# Patient Record
Sex: Male | Born: 1955 | Hispanic: No | Marital: Married | Smoking: Never smoker
Health system: Southern US, Community
[De-identification: ages and names within clinical notes are randomized; demographics above are authoritative.]

## PROBLEM LIST (undated history)

## (undated) DIAGNOSIS — H539 Unspecified visual disturbance: Secondary | ICD-10-CM

## (undated) DIAGNOSIS — J45909 Unspecified asthma, uncomplicated: Secondary | ICD-10-CM

## (undated) DIAGNOSIS — R413 Other amnesia: Secondary | ICD-10-CM

## (undated) DIAGNOSIS — C801 Malignant (primary) neoplasm, unspecified: Secondary | ICD-10-CM

## (undated) DIAGNOSIS — I10 Essential (primary) hypertension: Secondary | ICD-10-CM

## (undated) DIAGNOSIS — C4491 Basal cell carcinoma of skin, unspecified: Secondary | ICD-10-CM

## (undated) DIAGNOSIS — F419 Anxiety disorder, unspecified: Secondary | ICD-10-CM

## (undated) DIAGNOSIS — T7840XA Allergy, unspecified, initial encounter: Secondary | ICD-10-CM

## (undated) DIAGNOSIS — M1990 Unspecified osteoarthritis, unspecified site: Secondary | ICD-10-CM

## (undated) DIAGNOSIS — E785 Hyperlipidemia, unspecified: Secondary | ICD-10-CM

## (undated) HISTORY — DX: Basal cell carcinoma of skin, unspecified: C44.91

## (undated) HISTORY — DX: Other amnesia: R41.3

## (undated) HISTORY — DX: Hyperlipidemia, unspecified: E78.5

## (undated) HISTORY — DX: Unspecified asthma, uncomplicated: J45.909

## (undated) HISTORY — DX: Malignant (primary) neoplasm, unspecified: C80.1

## (undated) HISTORY — DX: Anxiety disorder, unspecified: F41.9

## (undated) HISTORY — DX: Unspecified osteoarthritis, unspecified site: M19.90

## (undated) HISTORY — DX: Allergy, unspecified, initial encounter: T78.40XA

## (undated) HISTORY — PX: KNEE ARTHROSCOPY W/ MENISCAL REPAIR: SHX1877

## (undated) HISTORY — DX: Unspecified visual disturbance: H53.9

## (undated) HISTORY — DX: Essential (primary) hypertension: I10

---

## 2006-05-19 ENCOUNTER — Encounter: Admission: RE | Admit: 2006-05-19 | Discharge: 2006-05-19 | Payer: Self-pay | Admitting: Internal Medicine

## 2009-04-03 ENCOUNTER — Ambulatory Visit: Payer: Self-pay | Admitting: Internal Medicine

## 2009-11-14 ENCOUNTER — Ambulatory Visit: Payer: Self-pay | Admitting: Internal Medicine

## 2011-06-24 ENCOUNTER — Other Ambulatory Visit: Payer: Self-pay

## 2011-06-24 DIAGNOSIS — J45909 Unspecified asthma, uncomplicated: Secondary | ICD-10-CM

## 2011-06-24 DIAGNOSIS — F411 Generalized anxiety disorder: Secondary | ICD-10-CM

## 2011-06-24 MED ORDER — ALBUTEROL SULFATE HFA 108 (90 BASE) MCG/ACT IN AERS: 2.0000 | INHALATION_SPRAY | Freq: Four times a day (QID) | RESPIRATORY_TRACT | Status: DC | PRN

## 2011-06-24 MED ORDER — ALPRAZOLAM 0.5 MG PO TBDP: 0.5000 mg | ORAL_TABLET | Freq: Two times a day (BID) | ORAL | Status: AC | PRN

## 2011-11-08 ENCOUNTER — Other Ambulatory Visit: Payer: Self-pay | Admitting: Internal Medicine

## 2011-11-08 DIAGNOSIS — Z0000 Encounter for general adult medical examination without abnormal findings: Secondary | ICD-10-CM

## 2011-11-08 DIAGNOSIS — Z125 Encounter for screening for malignant neoplasm of prostate: Secondary | ICD-10-CM

## 2011-11-08 LAB — CBC WITH DIFFERENTIAL/PLATELET
Eosinophils Absolute: 0.2 10*3/uL (ref 0.0–0.7)
Eosinophils Relative: 5 % (ref 0–5)
Hemoglobin: 14.9 g/dL (ref 13.0–17.0)
MCHC: 34.8 g/dL (ref 30.0–36.0)
Monocytes Absolute: 0.6 10*3/uL (ref 0.1–1.0)
Neutro Abs: 1.5 10*3/uL — ABNORMAL LOW (ref 1.7–7.7)
Platelets: 122 10*3/uL — ABNORMAL LOW (ref 150–400)
RBC: 4.38 MIL/uL (ref 4.22–5.81)

## 2011-11-08 LAB — COMPREHENSIVE METABOLIC PANEL
Alkaline Phosphatase: 47 U/L (ref 39–117)
BUN: 14 mg/dL (ref 6–23)
Calcium: 9.2 mg/dL (ref 8.4–10.5)
Creat: 0.98 mg/dL (ref 0.50–1.35)
Potassium: 4.2 mEq/L (ref 3.5–5.3)
Sodium: 139 mEq/L (ref 135–145)
Total Bilirubin: 1 mg/dL (ref 0.3–1.2)
Total Protein: 6.3 g/dL (ref 6.0–8.3)

## 2011-11-08 LAB — LIPID PANEL
HDL: 48 mg/dL (ref >39)
Total CHOL/HDL Ratio: 4.3 Ratio
Triglycerides: 115 mg/dL (ref <150)

## 2011-11-09 LAB — PSA: PSA: 1.4 ng/mL (ref <=4.00)

## 2011-11-11 ENCOUNTER — Ambulatory Visit (INDEPENDENT_AMBULATORY_CARE_PROVIDER_SITE_OTHER): Payer: BC Managed Care – PPO | Admitting: Internal Medicine

## 2011-11-11 ENCOUNTER — Encounter: Payer: Self-pay | Admitting: Internal Medicine

## 2011-11-11 VITALS — BP 138/94 | HR 76 | Temp 98.2°F | Ht 73.5 in | Wt 211.0 lb

## 2011-11-11 DIAGNOSIS — J309 Allergic rhinitis, unspecified: Secondary | ICD-10-CM

## 2011-11-11 DIAGNOSIS — J45909 Unspecified asthma, uncomplicated: Secondary | ICD-10-CM | POA: Insufficient documentation

## 2011-11-11 DIAGNOSIS — D229 Melanocytic nevi, unspecified: Secondary | ICD-10-CM

## 2011-11-11 DIAGNOSIS — D239 Other benign neoplasm of skin, unspecified: Secondary | ICD-10-CM

## 2011-11-11 DIAGNOSIS — F419 Anxiety disorder, unspecified: Secondary | ICD-10-CM | POA: Insufficient documentation

## 2011-11-11 DIAGNOSIS — F411 Generalized anxiety disorder: Secondary | ICD-10-CM

## 2011-11-11 DIAGNOSIS — Z85828 Personal history of other malignant neoplasm of skin: Secondary | ICD-10-CM | POA: Insufficient documentation

## 2011-11-11 DIAGNOSIS — Z0000 Encounter for general adult medical examination without abnormal findings: Secondary | ICD-10-CM

## 2011-11-11 LAB — POCT URINALYSIS DIPSTICK
Bilirubin, UA: NEGATIVE
Blood, UA: NEGATIVE
Leukocytes, UA: NEGATIVE
Nitrite, UA: NEGATIVE
Protein, UA: NEGATIVE
Spec Grav, UA: 1.015
Urobilinogen, UA: NEGATIVE

## 2011-11-11 NOTE — Progress Notes (Signed)
Subjective:    Patient ID: Melvin Dixon, male    DOB: Jul 21, 1955, 56 y.o.   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 He also has administrative duties and wants the head of the [employer] this past year. He is here for physical examination. History of asthma. He takes allergy injections. Is allergic to cats in addition to outdoor plants. Says the immunotherapy is helped a great deal. He is to run a great deal but doesn't do that is much. Works out some in [employer] He and his wife live in [place]. She is employed at [employer]. This is his second marriage. He has 2 daughters from previous marriage.  History of right lower lobe pneumonia December 1994. History of prostatitis March 2000. No known drug allergies. Has had vasectomy December 1993. Tdap 8/11.  On May 14 he had several lesions shave biopsied at [facility] by Dr. Londell Moh. He had an area on his right sideburn area that proved to be a basal cell carcinoma.  He had an area on his mid back that was an irritated seborrheic keratosis.  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  He also had an area right inner thigh that was shaved biopsied also an atypical combined melanocytic nevus.  In the last 2 biopsies, reports says the margins were involved and reexcision was recommended.  He went back on 09/03/2011 and had left calf lesion reexcised. Biopsy showed no residual atypical combined nevus. The area on the right inner thigh was also rebiopsied and showed an ulcer consistent with previous biopsy with intact epithelium at the edge. The knee feels her was a persistent combined nevus component with some degree of atypia. No conspicuous mitotic activity was noted. The changes extended to the base and was read by Dr. Pershing Proud. He was told he would need to see a Merchant navy officer.  Family history: No  change in family history except his sister died in the last couple of years from lung cancer and had a prior history of smoking. Mother still living with some memory issues. Father died at age 48 after for heart attacks. One brother in good health. 6 sisters living. Some siblings with seizure disorder. Father had hypertension. Both parents had diabetes.  Patient is a nonsmoker. Social alcohol consumption.    Review of Systems  Constitutional: Negative.   HENT: Negative.   Eyes: Negative.   Respiratory: Negative.   Cardiovascular: Negative.   Gastrointestinal: Negative.   Genitourinary: Negative.   Musculoskeletal: Negative.   Neurological: Negative.   Hematological: Negative.   Psychiatric/Behavioral:       History of occasional mild anxiety       Objective:   Physical Exam  Vitals reviewed. Constitutional: He is oriented to person, place, and time. He appears well-developed and well-nourished. No distress.  HENT:  Head: Normocephalic and atraumatic.  Right Ear: External ear normal.  Left Ear: External ear normal.  Mouth/Throat: Oropharynx is clear and moist. No oropharyngeal exudate.  Eyes: Conjunctivae and EOM are normal. Pupils are equal, round, and reactive to light. Right eye exhibits no discharge. Left eye exhibits no discharge. No scleral icterus.  Neck: Neck supple. No JVD present. No thyromegaly present.  Cardiovascular: Normal rate, regular rhythm, normal heart sounds and intact distal pulses.   No murmur heard. Pulmonary/Chest: Effort normal and breath sounds normal. No respiratory distress.  He has no wheezes. He has no rales. He exhibits no tenderness.  Abdominal: Bowel sounds are normal. He exhibits no distension and no mass. There is no tenderness. There is no rebound and no guarding.  Genitourinary: Rectum normal and prostate normal.  Musculoskeletal: Normal range of motion. He exhibits no edema.  Lymphadenopathy:    He has no cervical adenopathy.    Neurological: He is alert and oriented to person, place, and time. He has normal reflexes. No cranial nerve deficit. Coordination normal.  Skin: No rash noted. He is not diaphoretic.       Healing biopsy areas right inner thigh and left calf  Psychiatric: He has a normal mood and affect. His behavior is normal. Judgment and thought content normal.          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 Lesion right inner thigh shave biopsy shows persistent combined nevus component and likely needs attention by Mohs surgeon.  History of basal cell carcinoma right sideburn area  Plan: Appointment with Mohs surgeon. Refill inhalers as needed. Occasionally takes Xanax sparingly for anxiety. Return one year or as needed. Lab work reviewed with him. Has moderately elevated LDL but he does not want to be on statin therapy

## 2011-11-18 ENCOUNTER — Telehealth: Payer: Self-pay

## 2011-11-18 NOTE — Telephone Encounter (Signed)
Received fax today from [facility], stating patient has an appointment with Dr. Park Liter on 12/31/2011 at 3:30 pm. He is aware of this

## 2012-06-30 ENCOUNTER — Other Ambulatory Visit: Payer: Self-pay

## 2012-06-30 MED ORDER — FLUTICASONE-SALMETEROL 250-50 MCG/DOSE IN AEPB: 1.0000 | INHALATION_SPRAY | Freq: Two times a day (BID) | RESPIRATORY_TRACT | Status: DC

## 2013-09-08 ENCOUNTER — Other Ambulatory Visit: Payer: Self-pay

## 2013-09-08 MED ORDER — FLUTICASONE-SALMETEROL 250-50 MCG/DOSE IN AEPB: 1.0000 | INHALATION_SPRAY | Freq: Two times a day (BID) | RESPIRATORY_TRACT | Status: DC

## 2014-09-09 ENCOUNTER — Telehealth: Payer: Self-pay | Admitting: *Deleted

## 2014-09-09 MED ORDER — FLUTICASONE-SALMETEROL 250-50 MCG/DOSE IN AEPB: 1.0000 | INHALATION_SPRAY | Freq: Two times a day (BID) | RESPIRATORY_TRACT | Status: DC

## 2014-09-09 NOTE — Telephone Encounter (Signed)
Sent 1 refill on patient advair needs appt before anymore refills

## 2014-09-26 ENCOUNTER — Other Ambulatory Visit: Payer: BC Managed Care – PPO | Admitting: Internal Medicine

## 2014-09-26 DIAGNOSIS — Z13220 Encounter for screening for lipoid disorders: Secondary | ICD-10-CM

## 2014-09-26 DIAGNOSIS — Z125 Encounter for screening for malignant neoplasm of prostate: Secondary | ICD-10-CM

## 2014-09-26 DIAGNOSIS — Z0000 Encounter for general adult medical examination without abnormal findings: Secondary | ICD-10-CM

## 2014-09-26 LAB — LIPID PANEL
CHOL/HDL RATIO: 3.7 ratio
CHOLESTEROL: 217 mg/dL — AB (ref 0–200)
HDL: 58 mg/dL (ref >=40)
LDL Cholesterol: 143 mg/dL — ABNORMAL HIGH (ref 0–99)
Triglycerides: 79 mg/dL (ref <150)
VLDL: 16 mg/dL (ref 0–40)

## 2014-09-26 LAB — CBC WITH DIFFERENTIAL/PLATELET
Basophils Absolute: 0 10*3/uL (ref 0.0–0.1)
Basophils Relative: 0 % (ref 0–1)
Eosinophils Absolute: 0.2 10*3/uL (ref 0.0–0.7)
Eosinophils Relative: 6 % — ABNORMAL HIGH (ref 0–5)
HEMATOCRIT: 44.7 % (ref 39.0–52.0)
HEMOGLOBIN: 15.4 g/dL (ref 13.0–17.0)
LYMPHS ABS: 1.2 10*3/uL (ref 0.7–4.0)
LYMPHS PCT: 37 % (ref 12–46)
MCH: 34.1 pg — ABNORMAL HIGH (ref 26.0–34.0)
MCHC: 34.5 g/dL (ref 30.0–36.0)
MCV: 98.9 fL (ref 78.0–100.0)
MONO ABS: 0.4 10*3/uL (ref 0.1–1.0)
MONOS PCT: 14 % — AB (ref 3–12)
MPV: 10.3 fL (ref 8.6–12.4)
NEUTROS ABS: 1.4 10*3/uL — AB (ref 1.7–7.7)
NEUTROS PCT: 43 % (ref 43–77)
Platelets: 123 10*3/uL — ABNORMAL LOW (ref 150–400)
RBC: 4.52 MIL/uL (ref 4.22–5.81)
RDW: 14.6 % (ref 11.5–15.5)
WBC: 3.2 10*3/uL — AB (ref 4.0–10.5)

## 2014-09-26 LAB — COMPLETE METABOLIC PANEL WITH GFR
ALT: 23 U/L (ref 0–53)
AST: 17 U/L (ref 0–37)
Albumin: 4.5 g/dL (ref 3.5–5.2)
Alkaline Phosphatase: 51 U/L (ref 39–117)
BILIRUBIN TOTAL: 1 mg/dL (ref 0.2–1.2)
BUN: 15 mg/dL (ref 6–23)
CO2: 28 mEq/L (ref 19–32)
CREATININE: 1.05 mg/dL (ref 0.50–1.35)
Calcium: 9.2 mg/dL (ref 8.4–10.5)
Chloride: 103 mEq/L (ref 96–112)
GFR, Est African American: 89 mL/min
GFR, Est Non African American: 77 mL/min
Glucose, Bld: 90 mg/dL (ref 70–99)
Potassium: 4.7 mEq/L (ref 3.5–5.3)
SODIUM: 141 meq/L (ref 135–145)
TOTAL PROTEIN: 6.8 g/dL (ref 6.0–8.3)

## 2014-09-27 LAB — PSA: PSA: 2.3 ng/mL (ref <=4.00)

## 2014-10-03 ENCOUNTER — Encounter: Payer: Self-pay | Admitting: Internal Medicine

## 2014-10-03 ENCOUNTER — Ambulatory Visit (INDEPENDENT_AMBULATORY_CARE_PROVIDER_SITE_OTHER): Payer: BC Managed Care – PPO | Admitting: Internal Medicine

## 2014-10-03 VITALS — BP 158/98 | HR 88 | Temp 98.2°F | Ht 74.0 in | Wt 205.0 lb

## 2014-10-03 DIAGNOSIS — J4520 Mild intermittent asthma, uncomplicated: Secondary | ICD-10-CM | POA: Diagnosis not present

## 2014-10-03 DIAGNOSIS — G4700 Insomnia, unspecified: Secondary | ICD-10-CM | POA: Diagnosis not present

## 2014-10-03 DIAGNOSIS — N400 Enlarged prostate without lower urinary tract symptoms: Secondary | ICD-10-CM

## 2014-10-03 DIAGNOSIS — I10 Essential (primary) hypertension: Secondary | ICD-10-CM

## 2014-10-03 DIAGNOSIS — R413 Other amnesia: Secondary | ICD-10-CM | POA: Diagnosis not present

## 2014-10-03 DIAGNOSIS — Z0000 Encounter for general adult medical examination without abnormal findings: Secondary | ICD-10-CM | POA: Diagnosis not present

## 2014-10-03 DIAGNOSIS — J309 Allergic rhinitis, unspecified: Secondary | ICD-10-CM

## 2014-10-03 LAB — POCT URINALYSIS DIPSTICK
Bilirubin, UA: NEGATIVE
Glucose, UA: NEGATIVE
Ketones, UA: NEGATIVE
Leukocytes, UA: NEGATIVE
NITRITE UA: NEGATIVE
PROTEIN UA: NEGATIVE
RBC UA: NEGATIVE
SPEC GRAV UA: 1.025
UROBILINOGEN UA: NEGATIVE
pH, UA: 6

## 2014-10-03 MED ORDER — ALPRAZOLAM 0.5 MG PO TABS: 0.5000 mg | ORAL_TABLET | Freq: Every evening | ORAL | Status: DC | PRN

## 2014-10-03 NOTE — Patient Instructions (Signed)
Begin Cozaar 25 mg daily and return in 2 weeks. Pulmonary consult for possible sleep apnea. Brain scan. Check B-12 and folate levels for issues with memory. Xanax sparingly for occasional insomnia

## 2014-10-03 NOTE — Progress Notes (Signed)
Subjective:    Patient ID: Melvin Dixon, male    DOB: [birth year], 59 y.o.   MRN: [id number]  HPI 59 year old White Male in today for health maintenance exam and evaluation of medical issues. He has several new concerns today. His blood pressure has been elevated for number of weeks. 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 It is elevated today in the office on 2 separate occasions. He is concerned about some memory issues. Sometimes has trouble finding the right word. This is new but his wife told me about it a few months ago. She was quite concerned. He seemed to have trouble remembering his age today which was unusual. He is interested in a neurological evaluation.  He has a history of asthma and allergic rhinitis. He is on immunotherapy at [facility]. Says asthma is under very good control. He used to run a lot but is not really up to doing that much anymore. Today he walked 2 miles and worked out for 30 minutes on the elliptical machine.  He is concerned about possible sleep apnea. Has awakened a few times gasping for breath. We will refer him to pulmonologist for further evaluation.  Past medical history: History of right lower lobe pneumonia December 1994. Prostatitis March 2000. Mastectomy December 1993. Tetanus immunization update August 2011.  No known drug allergies  In May 2013 he had several lesion shaved biopsied by Dr. Sherrye Payor at [facility]. One of these areas all in the right sideburn root to be basal cell carcinoma. 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 Right inner thigh shaved biopsied showing atypical combined melanocytic nevus.  Family history: History of dementia in his mother. Father died at age 39 after several heart attacks. One brother in good health. 6 living sisters. Both parents had diabetes. Father had hypertension. Some siblings with seizure disorder. One sister died with lung cancer with  history of smoking.  Social history: He is married. 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 Has 2 adult daughters from first marriage. He resides in [place]. Wife works in [employer]. Nonsmoker. Social alcohol consumption.  Review of Systems some issues with insomnia. Would like small prescription for Xanax to take on occasion. History of BPH. Says he could not tolerate Flomax-caused issues with fatigue and decreased exercise tolerance     Objective:   Physical Exam  Constitutional: He appears well-developed and well-nourished. No distress.  HENT:  Head: Normocephalic and atraumatic.  Right Ear: External ear normal.  Left Ear: External ear normal.  Mouth/Throat: Oropharynx is clear and moist. No oropharyngeal exudate.  Eyes: Conjunctivae and EOM are normal. Pupils are equal, round, and reactive to light. Right eye exhibits no discharge. Left eye exhibits no discharge. No scleral icterus.  Neck: Neck supple. No JVD present. No thyromegaly present.  Cardiovascular: Normal rate, regular rhythm and normal heart sounds.   No murmur heard. Pulmonary/Chest: Effort normal and breath sounds normal. No respiratory distress. He has no wheezes.  Abdominal: Soft. Bowel sounds are normal. He exhibits no distension and no mass. There is no tenderness. There is no rebound and no guarding.  Genitourinary:  Prostate enlarged without nodules  Lymphadenopathy:    He has no cervical adenopathy.  Neurological: He is alert. He has normal reflexes. No cranial nerve deficit. Coordination normal.  Skin: Skin is warm and dry. No rash noted. He is not diaphoretic.  Psychiatric: He  has a normal mood and affect. His behavior is normal. Judgment and thought content normal.  Vitals reviewed.         Assessment & Plan:  Essential hypertension-start Cozaar 25 mg daily and return in 2 weeks.  Possible memory issues-he will have a brain scan. Need to rule out CVA with elevated blood  pressure. Is interested in neuropsychological testing as well. Check B-12 and folate levels in addition to TSH.  Possible sleep apnea-to have pulmonary consultation  Asthma-stable  Allergic rhinitis-on immunotherapy

## 2014-10-04 ENCOUNTER — Other Ambulatory Visit: Payer: BC Managed Care – PPO | Admitting: Internal Medicine

## 2014-10-04 DIAGNOSIS — R413 Other amnesia: Secondary | ICD-10-CM

## 2014-10-04 LAB — TSH: TSH: 1.23 u[IU]/mL (ref 0.350–4.500)

## 2014-10-04 LAB — FOLATE: FOLATE: 14.6 ng/mL

## 2014-10-04 LAB — T4, FREE: FREE T4: 1.08 ng/dL (ref 0.80–1.80)

## 2014-10-04 LAB — VITAMIN B12: Vitamin B-12: 266 pg/mL (ref 211–911)

## 2014-10-05 ENCOUNTER — Telehealth: Payer: Self-pay | Admitting: *Deleted

## 2014-10-05 DIAGNOSIS — R0683 Snoring: Secondary | ICD-10-CM

## 2014-10-05 DIAGNOSIS — R413 Other amnesia: Secondary | ICD-10-CM

## 2014-10-05 NOTE — Telephone Encounter (Signed)
Patient returned call appt information given also reviewed lab results with patient he states he will start B12 injections at his appt July 5th,2016

## 2014-10-05 NOTE — Telephone Encounter (Signed)
MRI Brain ordered . Scheduled appt with DR Elsworth Soho August 31st @3 :45 patient to arrive at 3:30. Left message for patient to call back to get appt information

## 2014-10-18 ENCOUNTER — Ambulatory Visit (INDEPENDENT_AMBULATORY_CARE_PROVIDER_SITE_OTHER): Payer: BC Managed Care – PPO | Admitting: Internal Medicine

## 2014-10-18 ENCOUNTER — Encounter: Payer: Self-pay | Admitting: Internal Medicine

## 2014-10-18 VITALS — BP 130/82 | HR 78 | Wt 205.0 lb

## 2014-10-18 DIAGNOSIS — E538 Deficiency of other specified B group vitamins: Secondary | ICD-10-CM

## 2014-10-18 DIAGNOSIS — I10 Essential (primary) hypertension: Secondary | ICD-10-CM

## 2014-10-18 DIAGNOSIS — R413 Other amnesia: Secondary | ICD-10-CM

## 2014-10-18 MED ORDER — CYANOCOBALAMIN 1000 MCG/ML IJ SOLN
1000.0000 ug | Freq: Once | INTRAMUSCULAR | Status: AC
  Administered 2014-10-18: 1000 ug via INTRAMUSCULAR

## 2014-10-19 ENCOUNTER — Telehealth: Payer: Self-pay | Admitting: *Deleted

## 2014-10-19 ENCOUNTER — Ambulatory Visit
Admission: RE | Admit: 2014-10-19 | Discharge: 2014-10-19 | Disposition: A | Discharge status: Home / Self Care | Payer: BC Managed Care – PPO | Source: Ambulatory Visit | Attending: Internal Medicine | Admitting: Internal Medicine

## 2014-10-19 DIAGNOSIS — R413 Other amnesia: Secondary | ICD-10-CM

## 2014-10-19 NOTE — Telephone Encounter (Signed)
Referrals placed for Neurology Dr Brett Fairy and Dr Valentina Shaggy [facility] for Memory loss

## 2014-10-20 ENCOUNTER — Other Ambulatory Visit: Payer: Self-pay | Admitting: Internal Medicine

## 2014-10-20 ENCOUNTER — Telehealth: Payer: Self-pay | Admitting: *Deleted

## 2014-10-20 DIAGNOSIS — R930 Abnormal findings on diagnostic imaging of skull and head, not elsewhere classified: Secondary | ICD-10-CM

## 2014-10-20 NOTE — Telephone Encounter (Signed)
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 They are recommending  MRA for further evaluation. Have asked patient call me to discuss report.

## 2014-10-20 NOTE — Telephone Encounter (Signed)
Order placed for MRA head(intracranial) authorization number [id number]

## 2014-10-24 ENCOUNTER — Ambulatory Visit: Payer: BC Managed Care – PPO | Admitting: Internal Medicine

## 2014-10-25 ENCOUNTER — Ambulatory Visit (INDEPENDENT_AMBULATORY_CARE_PROVIDER_SITE_OTHER): Payer: BC Managed Care – PPO | Admitting: Internal Medicine

## 2014-10-25 VITALS — BP 124/86 | HR 76

## 2014-10-25 DIAGNOSIS — E538 Deficiency of other specified B group vitamins: Secondary | ICD-10-CM | POA: Diagnosis not present

## 2014-10-25 MED ORDER — CYANOCOBALAMIN 1000 MCG/ML IJ SOLN
1000.0000 ug | Freq: Once | INTRAMUSCULAR | Status: AC
  Administered 2014-10-25: 1000 ug via INTRAMUSCULAR

## 2014-10-25 MED ORDER — LOSARTAN POTASSIUM 25 MG PO TABS: 25.0000 mg | ORAL_TABLET | Freq: Every day | ORAL | Status: DC

## 2014-10-25 NOTE — Progress Notes (Signed)
Patient presents today for B12 injection. VS stable. Patient tolerated injection well 

## 2014-10-27 ENCOUNTER — Ambulatory Visit
Admission: RE | Admit: 2014-10-27 | Discharge: 2014-10-27 | Disposition: A | Discharge status: Home / Self Care | Payer: BC Managed Care – PPO | Source: Ambulatory Visit | Attending: Internal Medicine | Admitting: Internal Medicine

## 2014-10-27 DIAGNOSIS — R930 Abnormal findings on diagnostic imaging of skull and head, not elsewhere classified: Secondary | ICD-10-CM

## 2014-10-31 ENCOUNTER — Ambulatory Visit (INDEPENDENT_AMBULATORY_CARE_PROVIDER_SITE_OTHER): Payer: BC Managed Care – PPO | Admitting: Internal Medicine

## 2014-10-31 DIAGNOSIS — R413 Other amnesia: Secondary | ICD-10-CM

## 2014-10-31 DIAGNOSIS — E538 Deficiency of other specified B group vitamins: Secondary | ICD-10-CM | POA: Diagnosis not present

## 2014-10-31 MED ORDER — CYANOCOBALAMIN 1000 MCG/ML IJ SOLN
1000.0000 ug | Freq: Once | INTRAMUSCULAR | Status: AC
  Administered 2014-10-31: 1000 ug via INTRAMUSCULAR

## 2014-10-31 NOTE — Progress Notes (Signed)
   Subjective:    Patient ID: Melvin Dixon, male    DOB: Jul 04, 1955, 59 y.o.   MRN: [id number]  HPI This is the Third out of 4  weekly B 12 injections. Next week will be the last one before he starts receiving monthly B-12 injections. Plans are to return in 3 months which will be early October for office visit and B-12 level. His recent MRI/angiogram was negative for aneurysm    Review of Systems     Objective:   Physical Exam        Assessment & Plan:  Return next week for fourth B-12 injection then proceed monthly

## 2014-10-31 NOTE — Patient Instructions (Signed)
This is the third week of B-12 injections. He will return next week. Then he will start receiving monthly B-12 injections.

## 2014-11-03 ENCOUNTER — Encounter: Payer: Self-pay | Admitting: Neurology

## 2014-11-03 ENCOUNTER — Ambulatory Visit (INDEPENDENT_AMBULATORY_CARE_PROVIDER_SITE_OTHER): Payer: BC Managed Care – PPO | Admitting: Neurology

## 2014-11-03 VITALS — BP 130/96 | HR 64 | Resp 14 | Ht 74.0 in | Wt 207.6 lb

## 2014-11-03 DIAGNOSIS — R0683 Snoring: Secondary | ICD-10-CM | POA: Diagnosis not present

## 2014-11-03 DIAGNOSIS — I10 Essential (primary) hypertension: Secondary | ICD-10-CM

## 2014-11-03 DIAGNOSIS — R413 Other amnesia: Secondary | ICD-10-CM

## 2014-11-03 DIAGNOSIS — R930 Abnormal findings on diagnostic imaging of skull and head, not elsewhere classified: Secondary | ICD-10-CM | POA: Diagnosis not present

## 2014-11-03 DIAGNOSIS — R9089 Other abnormal findings on diagnostic imaging of central nervous system: Secondary | ICD-10-CM | POA: Insufficient documentation

## 2014-11-03 NOTE — Progress Notes (Signed)
[facility]  PATIENT: Melvin Dixon DOB: 04/25/1955  REFERRING DOCTOR OR PCP:  Tedra Senegal SOURCE: patient and images on MRI  _________________________________   HISTORICAL  CHIEF COMPLAINT:  Chief Complaint  Patient presents with  . 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  Sts. he has had more difficulty with memory loss over the last 18 mos. or so.  Describes this as more of an issue with being easily distracted. MRI brain done 10-19-14 showed no cva/lesions/hydrocephalus, and MRA brain done 10-27-14 also neg. for acute findings  Sts. his mother has severe dementia./fim  . Hypertension    Sts. he was recently dx. with HTN, sts. since starting BP meds, memory seems to be some better./fim    HISTORY OF PRESENT ILLNESS:  I had the pleasure of seeing you patient, Melvin Dixon, at [facility] for neurologic consultation regarding his memory difficulties. 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 Some examples he gave was leaving water on at the house and forgetting conversations he had had with his wife. At the office, he noted apathy and more difficulty with certain tasks.   His mother has severe Alzheimer's at this time.     Last month, he was noted to have hypertension when his blood pressure was checked at work. He discussed his other issues with you at an office visit. An MRI of the brain was ordered. It showed some microvascular white matter changes that could be age related and there was some concern about a possible aneurysm but an MR angiogram confirmed that the MRI finding was just a blood vessel loop.  B12 was borderline low and he is being supplemented. Thyroid function was normal.  He has had sleep difficulties. He has snoring and has also awakened gasping for breath at times. He also reports some insomnia that has improved after his cats were  removed from the bedroom at night. He was having nocturia twice at night but that is better last month. He moves his legs some in the evening and when laying down at bedtime but his wife has not complained of his being extremely restless at night.  We discuss some symptoms of depression. He has noted some apathy but feels he is doing a little better now than he did earlier in the year.   As an example, he lost interest in playing golf, a favorite pastime, and now enjoys it again.  No tearfulness or irritability.  We did the Montral cognitive assessment today and he scored 29/30:  Montreal Cognitive Assessment  11/03/2014  Visuospatial/ Executive (0/5) 5  Naming (0/3) 3  Attention: Read list of digits (0/2) 2  Attention: Read list of letters (0/1) 1  Attention: Serial 7 subtraction starting at 100 (0/3) 3  Language: Repeat phrase (0/2) 2  Language : Fluency (0/1) 1  Abstraction (0/2) 2  Delayed Recall (0/5) 4  Orientation (0/6) 6  Total 29  Adjusted Score (based on education) 29    He is sometimes sleepy:  EPWORTH SLEEPINESS SCALE  On a scale of 0 - 3 what is the chance of dozing:  Sitting and Reading:   2   Watching TV:    0 Sitting inactive in a public place: 1 Passenger in car for one hour: 3 Lying down to rest in the afternoon: 3 Sitting and talking to someone: 0 Sitting quietly  after lunch:  0 In a car, stopped in traffic:  0  Total (out of 24):      9/24  (borderline normal)   REVIEW OF SYSTEMS: Constitutional: No fevers, chills, sweats, or change in appetite Eyes: No visual changes, double vision, eye pain Ear, nose and throat: No hearing loss, ear pain, nasal congestion, sore throat Cardiovascular: No chest pain, palpitations Respiratory: No shortness of breath at rest or with exertion.   No wheezes GastrointestinaI: No nausea, vomiting, diarrhea, abdominal pain, fecal incontinence Genitourinary: No dysuria, urinary retention or frequency.  No  nocturia. Musculoskeletal: No neck pain, back pain Integumentary: No rash, pruritus, skin lesions Neurological: as above Psychiatric: No depression at this time.  No anxiety Endocrine: No palpitations, diaphoresis, change in appetite, change in weigh or increased thirst Hematologic/Lymphatic: No anemia, purpura, petechiae. Allergic/Immunologic: No itchy/runny eyes, nasal congestion, recent allergic reactions, rashes  ALLERGIES: No Known Allergies  HOME MEDICATIONS:  Current outpatient prescriptions:  .  acetaminophen (TYLENOL) 500 MG tablet, Take 500 mg by mouth as needed., Disp: , Rfl:  .  ALPRAZolam (XANAX) 0.5 MG tablet, Take 1 tablet (0.5 mg total) by mouth at bedtime as needed., Disp: 30 tablet, Rfl: 0 .  cetirizine (ZYRTEC) 10 MG tablet, Take 10 mg by mouth daily., Disp: , Rfl:  .  Fluticasone-Salmeterol (ADVAIR) 250-50 MCG/DOSE AEPB, Inhale 1 puff into the lungs every 12 (twelve) hours., Disp: 60 each, Rfl: 0 .  ibuprofen (ADVIL,MOTRIN) 200 MG tablet, Take 200 mg by mouth every 6 (six) hours as needed., Disp: , Rfl:  .  losartan (COZAAR) 25 MG tablet, Take 1 tablet (25 mg total) by mouth daily., Disp: 30 tablet, Rfl: 2 .  naproxen (NAPROSYN) 375 MG tablet, Take 375 mg by mouth as needed., Disp: , Rfl:   PAST MEDICAL HISTORY: Past Medical History  Diagnosis Date  . Asthma   . Cancer   . Basal cell carcinoma   . Hypertension   . Memory loss   . Vision abnormalities     PAST SURGICAL HISTORY: Past Surgical History  Procedure Laterality Date  . Knee arthroscopy w/ meniscal repair Right     FAMILY HISTORY: Family History  Problem Relation Age of Onset  . Mental illness Mother   . Dementia Mother   . Heart disease Father     SOCIAL HISTORY:  History   Social History  . Marital Status: Legally Separated    Spouse Name: N/A  . Number of Children: N/A  . Years of Education: N/A   Occupational History  . Not on file.   Social History Main Topics  . Smoking  status: Never Smoker   . Smokeless tobacco: Never Used  . Alcohol Use: 0.5 oz/week    1 drink(s) per week  . Drug Use: No  . Sexual Activity: Not on file   Other Topics Concern  . Not on file   Social History Narrative     PHYSICAL EXAM  Filed Vitals:   11/03/14 0851  BP: 130/96  Pulse: 64  Resp: 14  Height: 6\' 2"  (1.88 m)  Weight: 207 lb 9.6 oz (94.167 kg)    Body mass index is 26.64 kg/(m^2).   General: The patient is well-developed and well-nourished and in no acute distress  Eyes:  Funduscopic exam shows normal optic discs and retinal vessels.  Neck: The neck is supple, no carotid bruits are noted.  The neck is nontender.  Cardiovascular: The heart has a regular rate and rhythm with a  normal S1 and S2. There were no murmurs, gallops or rubs. Lungs are clear to auscultation.  Skin: Extremities are without significant edema.  Musculoskeletal:  Back is nontender  Neurologic Exam  Mental status: The patient is alert and oriented x 3 at the time of the examination. The patient has apparent normal recent and remote memory, with an apparently normal attention span and concentration ability.   Speech is normal.  Cranial nerves: Extraocular movements are full. Pupils are equal, round, and reactive to light and accomodation.  Visual fields are full.  Facial symmetry is present. There is good facial sensation to soft touch bilaterally.Facial strength is normal.  Trapezius and sternocleidomastoid strength is normal. No dysarthria is noted.  The tongue is midline, and the patient has symmetric elevation of the soft palate. No obvious hearing deficits are noted.  Motor:  Muscle bulk is normal.   Tone is normal. Strength is  5 / 5 in all 4 extremities.   Sensory: Sensory testing is intact to pinprick, soft touch and vibration sensation in all 4 extremities.  Coordination: Cerebellar testing reveals good finger-nose-finger and heel-to-shin bilaterally.  Gait and station:  Station is normal.   Gait is normal. Tandem gait is normal. Romberg is negative.   Reflexes: Deep tendon reflexes are symmetric and normal bilaterally.   Plantar responses are flexor.    DIAGNOSTIC DATA (LABS, IMAGING, TESTING) - I reviewed patient records, labs, notes, testing and imaging myself where available.  Lab Results  Component Value Date   WBC 3.2* 09/26/2014   HGB 15.4 09/26/2014   HCT 44.7 09/26/2014   MCV 98.9 09/26/2014   PLT 123* 09/26/2014      Component Value Date/Time   NA 141 09/26/2014 0903   K 4.7 09/26/2014 0903   CL 103 09/26/2014 0903   CO2 28 09/26/2014 0903   GLUCOSE 90 09/26/2014 0903   BUN 15 09/26/2014 0903   CREATININE 1.05 09/26/2014 0903   CALCIUM 9.2 09/26/2014 0903   PROT 6.8 09/26/2014 0903   ALBUMIN 4.5 09/26/2014 0903   AST 17 09/26/2014 0903   ALT 23 09/26/2014 0903   ALKPHOS 51 09/26/2014 0903   BILITOT 1.0 09/26/2014 0903   GFRNONAA 77 09/26/2014 0903   GFRAA 89 09/26/2014 0903   Lab Results  Component Value Date   CHOL 217* 09/26/2014   HDL 58 09/26/2014   LDLCALC 143* 09/26/2014   TRIG 79 09/26/2014   CHOLHDL 3.7 09/26/2014   No results found for: HGBA1C Lab Results  Component Value Date   VITAMINB12 266 10/04/2014   Lab Results  Component Value Date   TSH 1.230 10/04/2014       ASSESSMENT AND PLAN  Memory loss - Plan: Home sleep test  Snoring - Plan: Home sleep test  Abnormal brain MRI  Essential hypertension, benign - Plan: Home sleep test   In summary, 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 His performance on theMontreal cognitive assessment was normal. I do not believe that he has a primary cognitive disorder. However, he does appear to have some difficulty with focus that is likely due to either sleep or a mood disturbance. I will check a sleep study to determine if he has sleep apnea and we can treat this if present. Alternatively, he might be  experiencing a mild depression. He test is normal, I would consider initiating an antidepressant will referring for counseling.  He will return to see me in 6  months for reevaluation of cognition but will return sooner if sleep apnea is present so we can initiate CPAP or other intervention. He should call sooner if there are any problems.   Richard A. Felecia Shelling, MD, PhD 1/70/0174, 9:44 AM Certified in Neurology, Clinical Neurophysiology, Sleep Medicine, Pain Medicine and Neuroimaging  [facility] [address], [place], [place] [address] [phone]

## 2014-11-07 ENCOUNTER — Telehealth: Payer: Self-pay | Admitting: Neurology

## 2014-11-07 ENCOUNTER — Ambulatory Visit (INDEPENDENT_AMBULATORY_CARE_PROVIDER_SITE_OTHER): Payer: BC Managed Care – PPO | Admitting: Internal Medicine

## 2014-11-07 VITALS — BP 128/86 | HR 84 | Temp 98.0°F

## 2014-11-07 DIAGNOSIS — R413 Other amnesia: Secondary | ICD-10-CM

## 2014-11-07 DIAGNOSIS — E538 Deficiency of other specified B group vitamins: Secondary | ICD-10-CM | POA: Diagnosis not present

## 2014-11-07 DIAGNOSIS — R0683 Snoring: Secondary | ICD-10-CM

## 2014-11-07 MED ORDER — CYANOCOBALAMIN 1000 MCG/ML IJ SOLN
1000.0000 ug | Freq: Once | INTRAMUSCULAR | Status: AC
  Administered 2014-11-07: 1000 ug via INTRAMUSCULAR

## 2014-11-07 NOTE — Progress Notes (Signed)
Patient presents today for B12 injection. VS Stable Patient tolerated injection well 

## 2014-11-07 NOTE — Telephone Encounter (Signed)
[employer] will not authorize a Home Sleep Study with a BMI <35.  This patient is covered at 100% for an in lab sleep test.  Could we get the order changed from HST to Split?

## 2014-11-12 ENCOUNTER — Encounter: Payer: Self-pay | Admitting: Internal Medicine

## 2014-11-12 NOTE — Progress Notes (Signed)
   Subjective:    Patient ID: Melvin Dixon, male    DOB: Sep 02, 1955, 59 y.o.   MRN: [id number]  HPI In today to follow-up on hypertension. Since last visit, indicates blood pressure has been better on medication. No side effects. Also has been found to have low B-12 level and is receiving B-12 injections. Evaluation of possible memory loss will be evaluated with brain scan in the near future. No new complaints or problems.    Review of Systems     Objective:   Physical Exam  Chest clear to auscultation. Cardiac exam regular rate and rhythm. Extremities without edema.      Assessment & Plan:  Essential hypertension  Memory loss  B-12 deficiency-B-12 level was 266 which fell within normal limits but low normal. We are trying B-12 injections to see if memory loss will improve

## 2014-11-12 NOTE — Patient Instructions (Addendum)
Continue B-12 injections as outlined. Have neurology consultation and brain scan. Follow-up in August. Continue Cozaar.

## 2014-11-14 ENCOUNTER — Ambulatory Visit (INDEPENDENT_AMBULATORY_CARE_PROVIDER_SITE_OTHER): Payer: BC Managed Care – PPO | Admitting: Internal Medicine

## 2014-11-14 VITALS — BP 120/84 | HR 84 | Temp 97.1°F | Wt 204.0 lb

## 2014-11-14 DIAGNOSIS — R413 Other amnesia: Secondary | ICD-10-CM | POA: Diagnosis not present

## 2014-11-14 DIAGNOSIS — I10 Essential (primary) hypertension: Secondary | ICD-10-CM | POA: Diagnosis not present

## 2014-11-14 DIAGNOSIS — E538 Deficiency of other specified B group vitamins: Secondary | ICD-10-CM | POA: Diagnosis not present

## 2014-11-14 MED ORDER — CYANOCOBALAMIN 1000 MCG/ML IJ SOLN
1000.0000 ug | Freq: Once | INTRAMUSCULAR | Status: AC
  Administered 2014-11-14: 1000 ug via INTRAMUSCULAR

## 2014-11-15 ENCOUNTER — Encounter: Payer: Self-pay | Admitting: Internal Medicine

## 2014-11-15 ENCOUNTER — Other Ambulatory Visit: Payer: Self-pay | Admitting: *Deleted

## 2014-11-15 MED ORDER — FLUTICASONE-SALMETEROL 250-50 MCG/DOSE IN AEPB: 1.0000 | INHALATION_SPRAY | Freq: Two times a day (BID) | RESPIRATORY_TRACT | Status: DC

## 2014-11-15 MED ORDER — LOSARTAN POTASSIUM 50 MG PO TABS: 50.0000 mg | ORAL_TABLET | Freq: Every day | ORAL | Status: DC

## 2014-11-15 NOTE — Progress Notes (Signed)
   Subjective:    Patient ID: Melvin Dixon, male    DOB: 1956-01-18, 59 y.o.   MRN: [id number]  HPI  In today to follow-up on essential hypertension. He is on Cozaar 25 mg daily. His blood pressure is excellent today but he brings in numerous readings from home blood pressure monitor. At times diastolic pressure is up to the mid to upper 90s. He had a recent scare with a possible abnormal MRI. He had an MRA and it was found that he did not have an aneurysm. This is a relief. He has seen the neurologist for evaluation of memory loss. He is also to see Dr. Valentina Shaggy in the near future. Mother is in her mid 72s and is had dementia for Lattie Haw she years. He will be visiting her in [place] in the near future. Needs refill on Advair inhaler. Neurologist felt he might be depressed. Patient doesn't think so. He does think he needs to find a way to cut back on some of his duties at work. May need to learn how to delegate some of these duties. 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 He is started exercising again and is finding that to be helpful. Neurologist did not feel that he had a cognitive disorder at the present time. He is going to have a sleep evaluation in the near future for history of snoring.    Review of Systems     Objective:   Physical Exam  Skin warm and dry. Nodes none. Chest clear. Cardiac exam regular rate and rhythm. Extremities without edema      Assessment & Plan:  Essential hypertension-increase losartan 50 mg daily and return in 4 weeks.  B 12 deficiency-B 12 level was low normal and is being supplemented  Memory loss-continue ongoing monitoring  Snoring-to have sleep apnea study  Plan: B-12 level was low normal. He doesn't know if this is helping memory issues or not but were gone up proceed with B-12 supplementation. Increase losartan to 50 mg daily. Return in 4 weeks.

## 2014-11-15 NOTE — Telephone Encounter (Signed)
advair refilled

## 2014-11-15 NOTE — Patient Instructions (Signed)
Increased losartan 50 mg daily. Return in 4 weeks. Continue to monitor blood pressure with home blood pressure monitor.

## 2014-12-09 ENCOUNTER — Ambulatory Visit (INDEPENDENT_AMBULATORY_CARE_PROVIDER_SITE_OTHER): Payer: BC Managed Care – PPO | Admitting: Neurology

## 2014-12-09 DIAGNOSIS — R0683 Snoring: Secondary | ICD-10-CM

## 2014-12-09 DIAGNOSIS — G478 Other sleep disorders: Secondary | ICD-10-CM

## 2014-12-09 DIAGNOSIS — R413 Other amnesia: Secondary | ICD-10-CM

## 2014-12-09 NOTE — Sleep Study (Signed)
Please see the scanned sleep study interpretation located in the procedure tab in the chart view section.  

## 2014-12-14 ENCOUNTER — Encounter: Payer: Self-pay | Admitting: Pulmonary Disease

## 2014-12-14 ENCOUNTER — Ambulatory Visit (INDEPENDENT_AMBULATORY_CARE_PROVIDER_SITE_OTHER): Payer: BC Managed Care – PPO | Admitting: Pulmonary Disease

## 2014-12-14 VITALS — BP 120/80 | HR 66 | Ht 73.5 in | Wt 207.8 lb

## 2014-12-14 DIAGNOSIS — G4700 Insomnia, unspecified: Secondary | ICD-10-CM | POA: Diagnosis not present

## 2014-12-14 NOTE — Assessment & Plan Note (Signed)
Rules of sleep hygiene were discussed  - light exercise x 30 mins daily -decrease caffeine intake - no more than 20 mins staying awake in bed, if not asleep, get out of bed & reading or light music - No TV or computer games at bedtime. -OK to trial melatonin 3-5 mg at bedtime  OK to take asthma inhaler sooner in the day

## 2014-12-14 NOTE — Progress Notes (Signed)
Subjective:    Patient ID: Melvin Dixon, male    DOB: May 09, 1955, 59 y.o.   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 He reports that for the past 2 years-he wakes up between 3 and 5 AM but 3 times per week, and then is unable to fall asleep again. On a couple of episodes he describes a fleeting sensation of drowning or choking during sleep-hence he underwent overnight polysomnogram on 8/26 at [facility] neurolog[facility]. He reports significant stressors related to work, his mother is going through advanced dementia, he went through a divorce in 2007. He does admit to occasional sadness of mood and a somewhat depressive outlook on life.  Epworth sleepiness score is 10. Bedtime is between 9 and 10 PM, sleep latency is minimal, he sleeps on his back with 2 pillows, reports 2-3 nocturnal awakenings and is out of bed by 5:30 AM, feeling rested without dryness of mouth or headaches. On morning study wakes up around 3:30 AM, he just gets into the shower and goes to work early.  He does seem to have mild persistent asthma-this has been attributed to an allergic etiology, he is taking allergy immunotherapy, testing was positive for cat dander dust mite and pollen. He takes Advair closer to bedtime.  He drinks 4 cups of coffee before noon daily and is trying to exercise for about 30 minutes daily. He does drink a beer or a glass of wine with dinner  Past Medical History  Diagnosis Date  . Asthma   . Cancer   . Basal cell carcinoma   . Hypertension   . Memory loss   . Vision abnormalities    Past Surgical History  Procedure Laterality Date  . Knee arthroscopy w/ meniscal repair Right     No Known Allergies  Social History   Social History  . Marital Status: Legally Separated    Spouse Name: N/A  . Number of Children: N/A  . Years of Education: N/A   Occupational History  . Not on file.   Social History Main Topics  .  Smoking status: Never Smoker   . Smokeless tobacco: Never Used  . Alcohol Use: 0.5 oz/week    1 drink(s) per week  . Drug Use: No  . Sexual Activity: Not on file   Other Topics Concern  . Not on file   Social History Narrative    Family History  Problem Relation Age of Onset  . Mental illness Mother   . Dementia Mother   . Heart disease Father     Review of Systems  Constitutional: Negative for fever, chills, activity change, appetite change and unexpected weight change.  HENT: Negative for congestion, dental problem, postnasal drip, rhinorrhea, sneezing, sore throat, trouble swallowing and voice change.   Eyes: Negative for visual disturbance.  Respiratory: Negative for cough, choking and shortness of breath.   Cardiovascular: Negative for chest pain and leg swelling.  Gastrointestinal: Negative for nausea, vomiting and abdominal pain.  Genitourinary: Negative for difficulty urinating.  Musculoskeletal: Negative for arthralgias.  Skin: Negative for rash.  Psychiatric/Behavioral: Negative for behavioral problems and confusion.       Objective:   Physical Exam  Gen. Pleasant, well-nourished, in no distress, normal affect ENT - no lesions, no post nasal drip Neck: No JVD, no thyromegaly, no carotid bruits Lungs: no use of accessory muscles, no dullness to percussion, clear without rales or rhonchi  Cardiovascular: Rhythm regular, heart  sounds  normal, no murmurs or gallops, no peripheral edema Abdomen: soft and non-tender, no hepatosplenomegaly, BS normal. Musculoskeletal: No deformities, no cyanosis or clubbing Neuro:  alert, non focal       Assessment & Plan:

## 2014-12-14 NOTE — Patient Instructions (Signed)
I will review sleep study & get back to you  Rules of sleep hygiene were discussed  - light exercise x 30 mins daily -decrease caffeine intake - no more than 20 mins staying awake in bed, if not asleep, get out of bed & reading or light music - No TV or computer games at bedtime. -OK to trial melatonin 3-5 mg at bedtime  OK to take asthma inhaler sooner in the day

## 2014-12-15 ENCOUNTER — Telehealth: Payer: Self-pay | Admitting: Neurology

## 2014-12-15 NOTE — Telephone Encounter (Signed)
Melvin Dixon/[facility] Dr. Elsworth Soho [phone] called. Dr. Elsworth Soho needs sleep study read and copy sent to them at fax# [phone]. Patient was in their office yesterday and they tried to phone our office during the time phone lines were down.

## 2014-12-15 NOTE — Telephone Encounter (Signed)
I have spoken with Melvin Dixon this morning--he had sleep study on 12-09-14, sts.  his pcp referred him to Dr. Elsworth Soho, and he would like sleep study results forwarded to Dr. Elsworth Soho.  I advised Dr. Felecia Shelling has been out of the office this week--he will return on Tuesday--as soon as sleep study is read, I will fax to Dr. Bari Mantis office per pt's request.  I have tried to call [name] at [facility]. back, but I only receive a message that "person at [phone] does not suscribe to this #"./fim

## 2014-12-16 ENCOUNTER — Ambulatory Visit (INDEPENDENT_AMBULATORY_CARE_PROVIDER_SITE_OTHER): Payer: BC Managed Care – PPO | Admitting: Internal Medicine

## 2014-12-16 ENCOUNTER — Encounter: Payer: Self-pay | Admitting: Internal Medicine

## 2014-12-16 VITALS — BP 124/82 | HR 64 | Temp 98.1°F | Ht 74.0 in | Wt 208.0 lb

## 2014-12-16 DIAGNOSIS — E538 Deficiency of other specified B group vitamins: Secondary | ICD-10-CM | POA: Diagnosis not present

## 2014-12-16 DIAGNOSIS — Z23 Encounter for immunization: Secondary | ICD-10-CM | POA: Diagnosis not present

## 2014-12-16 DIAGNOSIS — R413 Other amnesia: Secondary | ICD-10-CM

## 2014-12-16 DIAGNOSIS — I10 Essential (primary) hypertension: Secondary | ICD-10-CM

## 2014-12-16 LAB — VITAMIN B12

## 2014-12-16 MED ORDER — CYANOCOBALAMIN 1000 MCG/ML IJ SOLN
1000.0000 ug | Freq: Once | INTRAMUSCULAR | Status: AC
  Administered 2014-12-16: 1000 ug via INTRAMUSCULAR

## 2014-12-16 NOTE — Patient Instructions (Addendum)
Continue same medications and monthly B-12 injections. B 12 level drawn today. Return in 6 months for office visit and   blood pressure check. Proceed with neuropsychological testing.

## 2014-12-21 ENCOUNTER — Telehealth: Payer: Self-pay | Admitting: Neurology

## 2014-12-21 ENCOUNTER — Telehealth: Payer: Self-pay | Admitting: *Deleted

## 2014-12-21 NOTE — Telephone Encounter (Signed)
[facility] again to get sleep study results.  [phone] They left message with nurse to contact me regarding results. Awaiting call back from nurse.

## 2014-12-21 NOTE — Telephone Encounter (Signed)
I have spoken with Melvin Dixon and per RAS, advised him of sleep study results.  Per RAS, I have offered Lexapro, but he has declined, stating he will f/u with [facility].  I have faxed sleep study to Michelle/Dr. Elsworth Soho at [facility] per his request/fim

## 2014-12-21 NOTE — Telephone Encounter (Addendum)
Pt called to get results for sleep lab. Please call and advise [phone]

## 2014-12-21 NOTE — Telephone Encounter (Signed)
Sleep study faxed to Michelle/Dr. Elsworth Soho at fax# [phone]/fim

## 2014-12-21 NOTE — Telephone Encounter (Signed)
-----   Message from Britt Bottom, MD sent at 12/20/2014  6:36 PM EDT ----- Please let him know that the sleep study did not show any significant sleep apnea and his sleep efficiency was good. He did not have much deep sleep, but the study was otherwise normal.  We had discussed that if the sleep study was normal to recommend a trial of an antidepressant. If he wishes to proceed with this, please call in Lexapro 20 mg daily.

## 2014-12-21 NOTE — Telephone Encounter (Signed)
-----   Message from Rigoberto Noel, MD sent at 12/19/2014  5:48 PM EDT ----- Did not see his PSG report yet from [facility]  RA

## 2014-12-21 NOTE — Telephone Encounter (Signed)
Sleep study faxed to [facility] at below #/fim

## 2014-12-21 NOTE — Telephone Encounter (Signed)
Melvin Dixon/ Dr [facility] [phone] called to advise they haven't received the results of recent sleep study.

## 2014-12-21 NOTE — Telephone Encounter (Signed)
LMTC./fim 

## 2014-12-22 NOTE — Telephone Encounter (Signed)
Called and spoke with Melvin Dixon at [facility]. She states she will fax over results now Awaiting results.

## 2014-12-23 ENCOUNTER — Other Ambulatory Visit: Payer: Self-pay | Admitting: *Deleted

## 2014-12-23 MED ORDER — LOSARTAN POTASSIUM 50 MG PO TABS: 50.0000 mg | ORAL_TABLET | Freq: Every day | ORAL | Status: DC

## 2014-12-26 NOTE — Telephone Encounter (Signed)
Received, in RA's box for review

## 2014-12-29 NOTE — Telephone Encounter (Signed)
Patient notified.  Nothing further needed. 

## 2014-12-29 NOTE — Telephone Encounter (Signed)
lmtcb

## 2014-12-29 NOTE — Telephone Encounter (Signed)
Sorry for delay - he may gotten results of PSG from neurologist Reviewed PSG >> normal sleep architecture & REM sleep No evidence of OSA

## 2014-12-29 NOTE — Telephone Encounter (Signed)
Returning Michelle's call - [phone] (office #)

## 2015-01-08 ENCOUNTER — Encounter: Payer: Self-pay | Admitting: Internal Medicine

## 2015-01-08 NOTE — Progress Notes (Signed)
   Subjective:    Patient ID: Melvin Dixon, male    DOB: 1955-10-18, 59 y.o.   MRN: [id number]  HPI Here today to follow-up on essential hypertension and memory loss issues. Feels  memory loss is getting a bit better. He was found to have low normal vitamin B-12 level we started him on B-12 injections over the past few weeks. He has appointment have neuropsychological testing done  in the near future. He did see neurologist who thought perhaps he could be mildly depressed. He has been exercising some and he thinks that is helping. 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  His mother has dementia and is in a memory care unit in the [place]. He plans to visit her soon.  He had MRI of the brain in July raising question of possible aneurysm. Subsequently had MR angiogram that showed no aneurysm. No stroke or significant atrophy. Thought to have mild microvascular ischemic changes.  Neurologist referred him to pulmonologist regarding possible sleep apnea.  B-12 level in June before starting therapy was 266 normal being 211. Apparently did well on [facility] cognitive testing scoring 29 out of 30 when he saw neurologist in July.  Review of Systems     Objective:   Physical Exam Neck is supple without JVD thyromegaly or carotid bruits. Chest clear to auscultation. Cardiac exam regular rate and rhythm normal S1 and S2. Extremities without edema. Affect seems normal today       Assessment & Plan:  Low normal vitamin B12 level-treated with vitamin B-12 injections  Memory loss  Essential hypertension  Plan: Proceed with neuropsychological testing with Dr. Valentina Shaggy

## 2015-01-10 ENCOUNTER — Ambulatory Visit (INDEPENDENT_AMBULATORY_CARE_PROVIDER_SITE_OTHER): Payer: BC Managed Care – PPO | Admitting: *Deleted

## 2015-01-10 ENCOUNTER — Ambulatory Visit: Payer: BC Managed Care – PPO | Attending: Psychology | Admitting: Psychology

## 2015-01-10 DIAGNOSIS — R413 Other amnesia: Secondary | ICD-10-CM | POA: Insufficient documentation

## 2015-01-10 DIAGNOSIS — J309 Allergic rhinitis, unspecified: Secondary | ICD-10-CM | POA: Diagnosis not present

## 2015-01-10 NOTE — Progress Notes (Signed)
[facility]  [address]   Telephone [phone] [address] Fax [phone] [facility], [place] [address]  Initial Contact Note  Name:  Melvin Dixon Date of Birth; 17-Sep-1955 MRN:  [id number] Date:  01/10/2015  [name] is an 59 y.o. male who was referred for neuropsychological evaluation by Emeline General, MD due to his complaints of word finding difficulties over the past 18 months..    A total of 7 hours was spent today reviewing medical records, interviewing (CPT [phone]) [name] and his wife, administering and scoring neurocognitive tests (CPTs [phone] , [place]).  Preliminary Diagnostic Impression: Memory Difficulties due to depression [R41.3]  There were no concerns expressed or behaviors displayed by [name] that would require immediate attention.   A full report will follow once the planned testing has been completed. His next appointment is scheduled for 01/27/15 to provide feedback on this evaluation   Jamey Ripa, Ph.D Licensed Psychologist 01/10/2015

## 2015-01-17 ENCOUNTER — Ambulatory Visit (INDEPENDENT_AMBULATORY_CARE_PROVIDER_SITE_OTHER): Payer: BC Managed Care – PPO | Admitting: Internal Medicine

## 2015-01-17 DIAGNOSIS — E538 Deficiency of other specified B group vitamins: Secondary | ICD-10-CM

## 2015-01-17 MED ORDER — CYANOCOBALAMIN 1000 MCG/ML IJ SOLN
1000.0000 ug | Freq: Once | INTRAMUSCULAR | Status: AC
  Administered 2015-01-17: 1000 ug via INTRAMUSCULAR

## 2015-01-17 NOTE — Progress Notes (Signed)
Patient presents today for B12 injection 

## 2015-01-24 ENCOUNTER — Ambulatory Visit (INDEPENDENT_AMBULATORY_CARE_PROVIDER_SITE_OTHER): Payer: BC Managed Care – PPO | Admitting: *Deleted

## 2015-01-24 DIAGNOSIS — J309 Allergic rhinitis, unspecified: Secondary | ICD-10-CM

## 2015-01-27 ENCOUNTER — Telehealth: Payer: Self-pay | Admitting: Internal Medicine

## 2015-01-27 ENCOUNTER — Ambulatory Visit: Payer: BC Managed Care – PPO | Attending: Psychology | Admitting: Psychology

## 2015-01-27 DIAGNOSIS — R413 Other amnesia: Secondary | ICD-10-CM | POA: Insufficient documentation

## 2015-01-27 MED ORDER — FLUTICASONE-SALMETEROL 250-50 MCG/DOSE IN AEPB: 1.0000 | INHALATION_SPRAY | Freq: Two times a day (BID) | RESPIRATORY_TRACT | Status: DC

## 2015-01-27 NOTE — Telephone Encounter (Signed)
Refill x one year °

## 2015-01-27 NOTE — Telephone Encounter (Signed)
Rx for advair sent.  

## 2015-01-27 NOTE — Telephone Encounter (Signed)
Pt would like refill on Fluticasone-Salmeterol (ADVAIR) 250-50 .  Pharmacy [place] number to call pt is [phone]

## 2015-01-27 NOTE — Progress Notes (Signed)
[facility]  [address]   Telephone [phone] [address] Fax [phone] [place], [place] [address]   [place]* This report should not be released without the consent of the client  Name:   Carden Teel Date of Birth:  2055-05-22 [facility] MR#:  [id number] Dates of Evaluation: 01/10/15 (interview/testing) & 01/27/15 (feedback)  Reason for Referral Payam Gribble is a 59 year-old right-handed man who was referred for neuropsychological evaluation by Emeline General, MD as prompted by his complaint of an approximate eighteen month history of memory loss. Labs studies in June 2016 showed normal thyroid function but borderline low vitamin B-12 level. He reported some subjective improvement in memory and word finding after he was started on B-12 injections and an anti-hypertensive medication in June 2016, An MRI of the brain on 10/19/14 showed some mild subcortical and periventricular hyperintensities that were presumed to represent chronic microvascular ischemic changes. There was concern noted about a possible aneurysm but an MR angiogram on 10/27/14 indicated that the MRI finding was a blood vessel loop. A sleep study on 12/09/14 showed no signs of sleep apnea.   Sources of Information Electronic medical records from the [facility] were reviewed. 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 Valeria Batman, were interviewed.    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 His wife has not noticed him having word-finding difficulties, however. In addition, he has noticed having had a more difficult time following along with a lengthy story or narrative and recalling the exact sequence of recent events. His wife reported that over  the past year he has been forgetting details of recent conversations and has seemed less sure of direction finding while driving. He stated his belief that he has been more prone to procrastinate completing tasks both at home and at work, though his wife disagreed. He reported a slight improvement in his word-finding and memory subsequent to taking an anti-hypertensive medication and B-12 supplements in June 2016. He has not had any problems using household appliances or paying bills. 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 He reported not having received any negative job Adult nurse.  Another problem he cited was an eighteen month history of insomnia. He has been worried by multiple incidences of awakening and gasping for air. He stated his suspicion that he might be allergic to their cats. He reported feeling more tired during the day but without difficulty maintaining daytime alertness.   He denied problems with gait, balance, unilateral limb strength, vision, hearing, speech, appetite or swallowing. He reported mild knee and right hip pain that he attributed to osteoarthritis.  He did not report any changes in his mood or personality, which was confirmed by his wife. She described his typical demeanor as cheerful and pleasant. 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   Background His past medical history was notable for allergic rhinitis, asthma, hypertension (diagnosed in June 2016) and insomnia.   He denied history of head injury, loss of consciousness, seizure  activity, stroke-like symptoms, neurological infection or exposure to neurotoxic substances. He reported no history of alcohol abuse or use of illicit drugs.  He does not smoke.  He reported no history of serious emotional difficulties or use of psychiatric medication. He sought assistance from a psychological counselor over a several year period beginning in 2003 in context of going thru a divorce.    His current medications include acetaminophen as needed, alprazolam as needed at bedtime (used about twice per month per patient), cetirizine, fluticasone-salmeterol inhaler, losartan and naproxen.  His family medical history was notable for three of his sisters having epilepsy. He reported that his mother developed signs of dementia in her early 48 and currently resides in a memory care unit.  He lives with his wife of nearly six years. This is his second marriage. He was divorced in 2006 from his first wife after over 52 years of marriage. He has two daughters from previous marriage.  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  He holds a Pensions consultant in biology. He reported no history of attentional or learning difficulties. .  Observations He appeared as an appropriately dressed and groomed man in no apparent distress. He interacted in a consistently pleasant and cooperative manner. He spoke in a normal tone of voice, maintained good eye contact and responded to all questions. He seemed to be in good spirits and was responsive to humor.  His affect appeared within a wide range. His thought processes were coherent and organized without loose associations, verbal perseverations or flight of ideas. He was deemed to be a reliable informant. His thought content was devoid of unusual or bizarre ideas.  Evaluation Procedures Animal Naming Test  Beck Anxiety Inventory Beck Depression Inventory-II Boston Naming Test Controlled Oral Word Association Test Rey Complex Figure: Copy Stroop Color & Word Test Trail Making A & B Wechsler Adult Intelligence Scale-IV:   Music therapist, Coding, Digit Span, Matrix  Reasoning & Similarities Wechsler Memory Scale-IV Wide Range Achievement Test-4: Word Reading  Wisconsin Card Sorting Test Cognitive Functioning It was concluded that the test results represented a valid measure of his current cognitive functioning. He presented as alert, calm and cooperative. He did not report or display problems with vision (he wore his eyeglasses), hearing or motor skills. He had no difficulty understanding test directions. He appeared to expend maximal effort.   Overall, he performed within normal expectations on all tests within the neuropsychological test battery except on a test that measured his speed to read words and name color hues (Stroop Color-Word Test). This sole low score was not considered to be clinically significant as most cognitively intact persons will obtain a few low scores within a neuropsychological test battery. His performances on other measures of processing speed as well as on all measures of attention, learning, memory, language, visual-spatial organization, abstract reasoning and executive function were within normal expectations. His ability to learn and retain orally-presented information was a relative strength within the Superior range.   A listing of test results can be found at the end of this report.  Emotional Functioning On the Beck Depression Inventory-II his score of 16 fell within the mild range of depression. His most prominent symptoms (i.e., 2 on a 0 - 3 scale) were getting little pleasure from things he used to enjoy, feeling guilty most of the time and difficulty concentrating. He did not endorse having had suicidal thoughts within the past two weeks.   His  score of 17 on the Beck Anxiety Inventory fell within the moderate range. Of note, the symptom he rated as most distressing was "feeling of choking", which he linked to his sleep difficulties.       Summary & Conclusions Mikie Misner is a 59 year old man who reported an approximate  eighteen month to two year history of problems with concentration, memory and word finding. He reported subjective improvement in his cognitive functioning subsequent to starting on an anti-hypertensive medication and B-12 injections in June 2016. His other concern was insomnia with incidences of awakening during the night gasping for air. He has continued to function without major difficulties at home and at work.   Neuropsychological testing did not indicate cognitive disorder as he performed within normal expectations on most every cognitive test administered. His ability to learn and retain orally-presented information was a relative strength within the Superior range. With regards to his emotional functioning, he reported mild symptoms of depression and anxiety. In the interview, he cited feeling dissatisfied with and less interested in his job over the past two years.   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 He might be experiencing what is colloquially known as occupational "burn out".  Recommendations 1. He could consider psychological counseling.  2. He should consider taking a sabbatical from work when available.   3. The current test results comprise a baseline of his cognitive functioning. If he or someone who knows him well should perceive a significant change in his cognitive functioning in the coming years, then a repeat neuropsychological evaluation could be considered.    The results from this evaluation were discussed with him and his wife on 01/27/15. I have appreciated the opportunity to evaluate Dr. Romilda Garret. Please feel free to contact me with any comments or questions.     ______________________ Jamey Ripa, Ph.D Licensed Psychologist           ADDENDUM-NEUROPSYCHOLOGICAL TEST RESULTS  His test scores were  corrected to reflect norms for his age and whenever possible, his gender and educational level (i.e., 20 years).  Animal Naming Test Score= 22 42nd (adjusted for age, gender and educational level)    Financial trader Score= 57/60 32nd  (adjusted for age, gender and educational level)    Controlled Oral Word Association Test Score=  49 words/ 0 repetition 58th (adjusted for age, gender and educational level)   Rey Complex Figure: copy       Score= 34/36  Normal    Stroop Color Word Test  Score Residual % (adjusted for age and educational level)  Word 89 -24   5th    Color 66  -15 13th     Color-Word 35  -11 14th       Trails A Score=  22s  1e 62nd (adjusted for age, gender and educational level)  Trails B Score=  53s  1e 66th (adjusted for age, gender and educational level)    Wechsler Adult Intelligence Scale-IV   Subtest Scaled Score Percentile  Block Design 14 91st    Similarities 14 91st     Digit Span  Forward               Backward               Sequencing 12 11 13 10  75th  63rd   84th     50th     Matrix Reasoning 14 91st     Coding   11 63rd     Greycen Felter (MR#: [id number])-THYHOOILNZ. Report       Page 6   Wechsler Memory Scale-IV  Index Index Score Percentile  Immediate Memory 123  94th     Auditory Memory 124 95th      Visual Memory 108 70th      Delayed Memory 116 86th      Visual Working Memory  115 84th       [employer] Test  Total errors= 20 34th (adjusted for age and education)  Perseverative errors= 11 32nd     Categories=  6 >16th    Trials to first category= 11 >16th   Failure to maintain set  0 >16th    Learning to learn= -3.52 11th - 16th      Wide Range Achievement Test-4 Subtest  Raw score Standard score Percentile  Word Reading 65/70 109 73rd

## 2015-01-30 ENCOUNTER — Ambulatory Visit (INDEPENDENT_AMBULATORY_CARE_PROVIDER_SITE_OTHER): Payer: BC Managed Care – PPO | Admitting: Neurology

## 2015-01-30 DIAGNOSIS — J309 Allergic rhinitis, unspecified: Secondary | ICD-10-CM

## 2015-02-16 ENCOUNTER — Ambulatory Visit (INDEPENDENT_AMBULATORY_CARE_PROVIDER_SITE_OTHER): Payer: BC Managed Care – PPO | Admitting: Internal Medicine

## 2015-02-16 ENCOUNTER — Ambulatory Visit (INDEPENDENT_AMBULATORY_CARE_PROVIDER_SITE_OTHER): Payer: BC Managed Care – PPO

## 2015-02-16 DIAGNOSIS — E538 Deficiency of other specified B group vitamins: Secondary | ICD-10-CM | POA: Diagnosis not present

## 2015-02-16 DIAGNOSIS — J309 Allergic rhinitis, unspecified: Secondary | ICD-10-CM | POA: Diagnosis not present

## 2015-02-16 MED ORDER — CYANOCOBALAMIN 1000 MCG/ML IJ SOLN: 1000.0000 ug | Freq: Once | INTRAMUSCULAR | Status: DC

## 2015-02-16 MED ORDER — CYANOCOBALAMIN 1000 MCG/ML IJ SOLN
1000.0000 ug | Freq: Once | INTRAMUSCULAR | Status: AC
  Administered 2015-02-16: 1000 ug via INTRAMUSCULAR

## 2015-02-16 NOTE — Progress Notes (Signed)
Patient received 11ml in left ventroglute. Tolerated well.

## 2015-02-24 ENCOUNTER — Ambulatory Visit (INDEPENDENT_AMBULATORY_CARE_PROVIDER_SITE_OTHER): Payer: BC Managed Care – PPO | Admitting: *Deleted

## 2015-02-24 DIAGNOSIS — J309 Allergic rhinitis, unspecified: Secondary | ICD-10-CM | POA: Diagnosis not present

## 2015-03-21 ENCOUNTER — Ambulatory Visit (INDEPENDENT_AMBULATORY_CARE_PROVIDER_SITE_OTHER): Payer: BC Managed Care – PPO | Admitting: Internal Medicine

## 2015-03-21 ENCOUNTER — Ambulatory Visit (INDEPENDENT_AMBULATORY_CARE_PROVIDER_SITE_OTHER): Payer: BC Managed Care – PPO

## 2015-03-21 DIAGNOSIS — E538 Deficiency of other specified B group vitamins: Secondary | ICD-10-CM

## 2015-03-21 DIAGNOSIS — J309 Allergic rhinitis, unspecified: Secondary | ICD-10-CM

## 2015-03-21 MED ORDER — CYANOCOBALAMIN 1000 MCG/ML IJ SOLN
1000.0000 ug | Freq: Once | INTRAMUSCULAR | Status: AC
  Administered 2015-03-21: 1000 ug via INTRAMUSCULAR

## 2015-03-21 NOTE — Progress Notes (Signed)
Patient given 1066mcg in right ventroglute. Tolerated well.

## 2015-04-03 ENCOUNTER — Ambulatory Visit (INDEPENDENT_AMBULATORY_CARE_PROVIDER_SITE_OTHER): Payer: BC Managed Care – PPO

## 2015-04-03 ENCOUNTER — Other Ambulatory Visit: Payer: Self-pay

## 2015-04-03 DIAGNOSIS — J309 Allergic rhinitis, unspecified: Secondary | ICD-10-CM

## 2015-04-03 MED ORDER — LOSARTAN POTASSIUM 50 MG PO TABS: 50.0000 mg | ORAL_TABLET | Freq: Every day | ORAL | Status: DC

## 2015-04-05 ENCOUNTER — Encounter: Payer: Self-pay | Admitting: Internal Medicine

## 2015-04-05 ENCOUNTER — Ambulatory Visit (INDEPENDENT_AMBULATORY_CARE_PROVIDER_SITE_OTHER): Payer: BC Managed Care – PPO | Admitting: Internal Medicine

## 2015-04-05 ENCOUNTER — Telehealth: Payer: Self-pay

## 2015-04-05 VITALS — BP 136/90 | HR 64 | Temp 98.5°F | Resp 20 | Ht 73.5 in | Wt 210.0 lb

## 2015-04-05 DIAGNOSIS — J069 Acute upper respiratory infection, unspecified: Secondary | ICD-10-CM | POA: Diagnosis not present

## 2015-04-05 DIAGNOSIS — Z8709 Personal history of other diseases of the respiratory system: Secondary | ICD-10-CM

## 2015-04-05 DIAGNOSIS — H6503 Acute serous otitis media, bilateral: Secondary | ICD-10-CM | POA: Diagnosis not present

## 2015-04-05 MED ORDER — ALBUTEROL SULFATE HFA 108 (90 BASE) MCG/ACT IN AERS: 2.0000 | INHALATION_SPRAY | Freq: Four times a day (QID) | RESPIRATORY_TRACT | Status: DC | PRN

## 2015-04-05 MED ORDER — CLARITHROMYCIN 500 MG PO TABS: 500.0000 mg | ORAL_TABLET | Freq: Two times a day (BID) | ORAL | Status: DC

## 2015-04-05 MED ORDER — PREDNISONE 10 MG PO TABS: ORAL_TABLET | ORAL | Status: DC

## 2015-04-05 MED ORDER — HYDROCODONE-HOMATROPINE 5-1.5 MG/5ML PO SYRP: 5.0000 mL | ORAL_SOLUTION | Freq: Three times a day (TID) | ORAL | Status: DC | PRN

## 2015-04-05 NOTE — Telephone Encounter (Signed)
Can he come today before 1 pm?

## 2015-04-05 NOTE — Telephone Encounter (Signed)
Patient on his way

## 2015-04-05 NOTE — Progress Notes (Signed)
   Subjective:    Patient ID: Melvin Dixon, male    DOB: Feb 15, 1956, 59 y.o.   MRN: [id number]  HPI Patient leaving to drive to [place] to visit and losses coming Saturday. Has come down with respiratory infection. Has had very slight wheezing. Has Advair inhaler but no albuterol inhaler to present time. This was refilled. No fever or shaking chills. Some right ear discomfort. Cough is productive. History of asthma.     Review of Systems as above     Objective:   Physical Exam Skin warm and dry. Nodes, small anterior cervical nodes bilaterally. Varix slightly injected without exudate. Right TM full but not red. Left TM slightly full. Neck is supple. Chest clear to auscultation without rales or wheezing. Has congested cough.       Assessment & Plan:  Acute bronchitis  Bilateral serous otitis media  Plan: Biaxin 500 mg twice daily for 10 days. Sterapred DS 10 mg 6 day dosepak to start if wheezing gets worse. Hycodan 1 teaspoon by mouth every 8 hours when necessary cough. Albuterol inhaler 2 sprays by mouth 4 times daily. Advair inhaler 1 spray by mouth every 12 hours.

## 2015-04-05 NOTE — Patient Instructions (Signed)
Biaxin 500 mg twice daily for 10 days. Albuterol inhaler 2 sprays by mouth 4 times a day. Advair inhaler one spray by mouth every 12 hours. Start prednisone if wheezing gets worse. Hycodan 1 teaspoon by mouth every 8 hours when necessary cough

## 2015-04-05 NOTE — Telephone Encounter (Signed)
Patient contacted office stating the he thinks that he has come down with something and that he has travel plans for Saturday. He c/o a lot of coughing and shortness of breath associated with coughing that started around Sunday. No fever.

## 2015-04-19 ENCOUNTER — Ambulatory Visit (INDEPENDENT_AMBULATORY_CARE_PROVIDER_SITE_OTHER): Payer: BC Managed Care – PPO

## 2015-04-19 DIAGNOSIS — J309 Allergic rhinitis, unspecified: Secondary | ICD-10-CM

## 2015-04-24 ENCOUNTER — Ambulatory Visit (INDEPENDENT_AMBULATORY_CARE_PROVIDER_SITE_OTHER): Payer: BC Managed Care – PPO | Admitting: Internal Medicine

## 2015-04-24 DIAGNOSIS — D519 Vitamin B12 deficiency anemia, unspecified: Secondary | ICD-10-CM | POA: Diagnosis not present

## 2015-04-24 MED ORDER — CYANOCOBALAMIN 1000 MCG/ML IJ SOLN
1000.0000 ug | Freq: Once | INTRAMUSCULAR | Status: AC
  Administered 2015-04-24: 1000 ug via INTRAMUSCULAR

## 2015-05-01 ENCOUNTER — Encounter: Payer: Self-pay | Admitting: Neurology

## 2015-05-01 ENCOUNTER — Ambulatory Visit (INDEPENDENT_AMBULATORY_CARE_PROVIDER_SITE_OTHER): Payer: BC Managed Care – PPO | Admitting: Neurology

## 2015-05-01 ENCOUNTER — Ambulatory Visit (INDEPENDENT_AMBULATORY_CARE_PROVIDER_SITE_OTHER): Payer: BC Managed Care – PPO

## 2015-05-01 VITALS — BP 150/92 | HR 68 | Resp 14 | Ht 73.5 in | Wt 208.0 lb

## 2015-05-01 DIAGNOSIS — J309 Allergic rhinitis, unspecified: Secondary | ICD-10-CM | POA: Diagnosis not present

## 2015-05-01 DIAGNOSIS — I10 Essential (primary) hypertension: Secondary | ICD-10-CM | POA: Diagnosis not present

## 2015-05-01 DIAGNOSIS — R930 Abnormal findings on diagnostic imaging of skull and head, not elsewhere classified: Secondary | ICD-10-CM

## 2015-05-01 DIAGNOSIS — R413 Other amnesia: Secondary | ICD-10-CM | POA: Diagnosis not present

## 2015-05-01 DIAGNOSIS — G4700 Insomnia, unspecified: Secondary | ICD-10-CM | POA: Diagnosis not present

## 2015-05-01 DIAGNOSIS — E538 Deficiency of other specified B group vitamins: Secondary | ICD-10-CM

## 2015-05-01 DIAGNOSIS — R9089 Other abnormal findings on diagnostic imaging of central nervous system: Secondary | ICD-10-CM

## 2015-05-01 MED ORDER — CYCLOBENZAPRINE HCL 5 MG PO TABS: ORAL_TABLET | ORAL | Status: DC

## 2015-05-01 NOTE — Progress Notes (Signed)
[facility]  PATIENT: Melvin Dixon DOB: April 04, 1956  REFERRING DOCTOR OR PCP:  Tedra Senegal SOURCE: patient and images on MRI  _________________________________   HISTORICAL  CHIEF COMPLAINT:  Chief Complaint  Patient presents with  . Memory Loss    Pt. had a normal score on his initial [place].  Sleep study didn't show any sig. osa.  He declined an antidepressant.  Sts. he feels memory has improved in the last 6 mos--due to a variety of things--vit. B12 inj, his is exercising more.  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 He feels these issues are better after starting B12 and exercising more.    However, he still has some mild cognitive issues at times.   Both he and his wife have noted occurences of decreased memory.       An MRI of the brain 10/19/2014 showed some microvascular white matter changes that could be age related and there was some concern about a possible aneurysm but an MR angiogram confirmed that the MRI finding was just a blood vessel loop.  B12 was borderline low and he is being supplemented. Thyroid function was normal.     He has had sleep difficulties with sleep maintenance.   If he wakes up he has trouble falling back asleep 3/7 nights.   His wife notes he does get muscle cramps and toe pain and often has trouble getting comfortable.  . He has snoring and has also awakened gasping for breath at times. He also reports some insomnia that has improved after his cats were removed from the bedroom at night. He was having nocturia twice at night but that is better last month. He moves his legs some in the evening and when laying down at bedtime but his wife has not complained of his being extremely restless at night.     He sometimes uses 1/2 of a Xanax with some benefit  A PSG after his  initial visit with me did not show any significant sleep apnea. Sleep efficiency was excellent at 91%. There were no significant restless leg or periodic limb movement of sleep issues.   Sleep onset latency was rapid at 8 minutes.        We discuss some symptoms of depression. He has noted some apathy.     His wife notes he has had some anxiety more than depression.    Mood seems better since starting B12 and exercising more.     At the last visit,  Montral cognitive assessment was performed::  Montreal Cognitive Assessment  11/03/2014  Visuospatial/ Executive (0/5) 5  Naming (0/3) 3  Attention: Read list of digits (0/2) 2  Attention: Read list of letters (0/1) 1  Attention: Serial 7 subtraction starting at 100 (0/3) 3  Language: Repeat phrase (0/2) 2  Language : Fluency (0/1) 1  Abstraction (0/2) 2  Delayed Recall (0/5) 4  Orientation (0/6) 6  Total 29  Adjusted Score (based on education) 29      REVIEW OF SYSTEMS: Constitutional: No fevers, chills, sweats, or change in appetite Eyes: No visual changes, double vision, eye pain Ear, nose and throat: No hearing loss, ear pain, nasal congestion, sore throat Cardiovascular: No chest pain, palpitations Respiratory: No shortness of breath at rest or with exertion.   No wheezes GastrointestinaI: No nausea,  vomiting, diarrhea, abdominal pain, fecal incontinence Genitourinary: No dysuria, urinary retention or frequency.  No nocturia. Musculoskeletal: No neck pain, back pain Integumentary: No rash, pruritus, skin lesions Neurological: as above Psychiatric: No depression at this time.  No anxiety Endocrine: No palpitations, diaphoresis, change in appetite, change in weigh or increased thirst Hematologic/Lymphatic: No anemia, purpura, petechiae. Allergic/Immunologic: No itchy/runny eyes, nasal congestion, recent allergic reactions, rashes  ALLERGIES: No Known Allergies  HOME MEDICATIONS:  Current outpatient prescriptions:  .   acetaminophen (TYLENOL) 500 MG tablet, Take 500 mg by mouth as needed., Disp: , Rfl:  .  albuterol (PROVENTIL HFA;VENTOLIN HFA) 108 (90 BASE) MCG/ACT inhaler, Inhale 2 puffs into the lungs every 6 (six) hours as needed for wheezing or shortness of breath., Disp: 1 Inhaler, Rfl: 3 .  ALPRAZolam (XANAX) 0.5 MG tablet, Take 1 tablet (0.5 mg total) by mouth at bedtime as needed., Disp: 30 tablet, Rfl: 0 .  cetirizine (ZYRTEC) 10 MG tablet, Take 10 mg by mouth daily., Disp: , Rfl:  .  Fluticasone-Salmeterol (ADVAIR) 250-50 MCG/DOSE AEPB, Inhale 1 puff into the lungs every 12 (twelve) hours., Disp: 60 each, Rfl: 11 .  ibuprofen (ADVIL,MOTRIN) 200 MG tablet, Take 200 mg by mouth every 6 (six) hours as needed., Disp: , Rfl:  .  losartan (COZAAR) 50 MG tablet, Take 1 tablet (50 mg total) by mouth daily., Disp: 90 tablet, Rfl: 1 .  naproxen (NAPROSYN) 375 MG tablet, Take 375 mg by mouth as needed., Disp: , Rfl:   PAST MEDICAL HISTORY: Past Medical History  Diagnosis Date  . Asthma   . Cancer ([place])   . Basal cell carcinoma   . Hypertension   . Memory loss   . Vision abnormalities     PAST SURGICAL HISTORY: Past Surgical History  Procedure Laterality Date  . Knee arthroscopy w/ meniscal repair Right     FAMILY HISTORY: Family History  Problem Relation Age of Onset  . Mental illness Mother   . Dementia Mother   . Heart disease Father     SOCIAL HISTORY:  Social History No smoking Social alcohol Married   PHYSICAL EXAM  Filed Vitals:   05/01/15 0841  BP: 150/92  Pulse: 68  Resp: 14  Height: 6' 1.5" (1.867 m)  Weight: 208 lb (94.348 kg)    Body mass index is 27.07 kg/(m^2).   General: The patient is well-developed and well-nourished and in no acute distress   Neurologic Exam  Mental status: The patient is alert and oriented x 3 at the time of the examination. The patient has apparent normal recent and remote memory, with an apparently normal attention span and  concentration ability.   Speech is normal.  Cranial nerves: Extraocular movements are full.   Facial symmetry is present. There is good facial sensation to soft touch bilaterally.Facial strength is normal.  Trapezius and sternocleidomastoid strength is normal. No dysarthria is noted.  The tongue is midline, and the patient has symmetric elevation of the soft palate. No obvious hearing deficits are noted.  Motor:  Muscle bulk is normal.   Tone is normal. Strength is  5 / 5 in all 4 extremities.   Sensory: Sensory testing is intact to pinprick, soft touch and vibration sensation in all 4 extremities.  Coordination: Cerebellar testing reveals good finger-nose-finger and heel-to-shin bilaterally.  Gait and station: Station is normal.   Gait is normal. Tandem gait is normal. Romberg is negative.   Reflexes: Deep tendon reflexes are symmetric and normal bilaterally.  DIAGNOSTIC DATA (LABS, IMAGING, TESTING) - I reviewed patient records, labs, notes, testing and imaging myself where available.  Lab Results  Component Value Date   WBC 3.2* 09/26/2014   HGB 15.4 09/26/2014   HCT 44.7 09/26/2014   MCV 98.9 09/26/2014   PLT 123* 09/26/2014      Component Value Date/Time   NA 141 09/26/2014 0903   K 4.7 09/26/2014 0903   CL 103 09/26/2014 0903   CO2 28 09/26/2014 0903   GLUCOSE 90 09/26/2014 0903   BUN 15 09/26/2014 0903   CREATININE 1.05 09/26/2014 0903   CALCIUM 9.2 09/26/2014 0903   PROT 6.8 09/26/2014 0903   ALBUMIN 4.5 09/26/2014 0903   AST 17 09/26/2014 0903   ALT 23 09/26/2014 0903   ALKPHOS 51 09/26/2014 0903   BILITOT 1.0 09/26/2014 0903   GFRNONAA 77 09/26/2014 0903   GFRAA 89 09/26/2014 0903   Lab Results  Component Value Date   CHOL 217* 09/26/2014   HDL 58 09/26/2014   LDLCALC 143* 09/26/2014   TRIG 79 09/26/2014   CHOLHDL 3.7 09/26/2014   No results found for: HGBA1C Lab Results  Component Value Date   VITAMINB12 >2000* 12/16/2014   Lab Results    Component Value Date   TSH 1.230 10/04/2014       ASSESSMENT AND PLAN  Memory loss  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 He feels has improved since the last visit, since starting B12 and exercising more. The etiology is unclear.   Brain volume is low normal for age but the mesial temporal lobes are not atrophied, making Alzheimer's less likely. More likely, he is having secondary cognitive issues due to depression or poor sleep. 1.     Cyclobenzaprine 5 - 10 mg po qHS to help with sleep maintenance insomnia and spasms 2.     We discussed an antidepressant but he would like to hold off at this point.    He will return to see me in 6 months for reevaluation.   He should call sooner if there are any problems.   Asberry Lascola A. Felecia Shelling, MD, PhD [id number], [id number] AM Certified in Neurology, Clinical Neurophysiology, Sleep Medicine, Pain Medicine and Neuroimaging  [facility] [address] [place], [place] [address] [phone]

## 2015-05-11 ENCOUNTER — Ambulatory Visit (INDEPENDENT_AMBULATORY_CARE_PROVIDER_SITE_OTHER): Payer: BC Managed Care – PPO | Admitting: Neurology

## 2015-05-11 DIAGNOSIS — J309 Allergic rhinitis, unspecified: Secondary | ICD-10-CM

## 2015-06-08 ENCOUNTER — Ambulatory Visit (INDEPENDENT_AMBULATORY_CARE_PROVIDER_SITE_OTHER): Payer: BC Managed Care – PPO

## 2015-06-08 DIAGNOSIS — J309 Allergic rhinitis, unspecified: Secondary | ICD-10-CM

## 2015-06-14 ENCOUNTER — Ambulatory Visit (INDEPENDENT_AMBULATORY_CARE_PROVIDER_SITE_OTHER): Payer: BC Managed Care – PPO

## 2015-06-14 DIAGNOSIS — J309 Allergic rhinitis, unspecified: Secondary | ICD-10-CM | POA: Diagnosis not present

## 2015-06-22 ENCOUNTER — Ambulatory Visit (INDEPENDENT_AMBULATORY_CARE_PROVIDER_SITE_OTHER): Payer: BC Managed Care – PPO

## 2015-06-22 DIAGNOSIS — J309 Allergic rhinitis, unspecified: Secondary | ICD-10-CM | POA: Diagnosis not present

## 2015-06-23 ENCOUNTER — Ambulatory Visit (INDEPENDENT_AMBULATORY_CARE_PROVIDER_SITE_OTHER): Payer: BC Managed Care – PPO | Admitting: Internal Medicine

## 2015-06-23 ENCOUNTER — Encounter: Payer: Self-pay | Admitting: Internal Medicine

## 2015-06-23 VITALS — BP 142/96 | HR 80 | Temp 97.4°F | Resp 20 | Ht 74.0 in | Wt 207.5 lb

## 2015-06-23 DIAGNOSIS — R413 Other amnesia: Secondary | ICD-10-CM

## 2015-06-23 DIAGNOSIS — E538 Deficiency of other specified B group vitamins: Secondary | ICD-10-CM | POA: Diagnosis not present

## 2015-06-23 DIAGNOSIS — I10 Essential (primary) hypertension: Secondary | ICD-10-CM | POA: Diagnosis not present

## 2015-06-23 MED ORDER — LOSARTAN POTASSIUM 100 MG PO TABS: ORAL_TABLET | ORAL | Status: DC

## 2015-06-23 MED ORDER — B-12 1000 MCG/ML IJ KIT: 1000.0000 ug | PACK | INTRAMUSCULAR | Status: DC

## 2015-06-23 MED ORDER — CYANOCOBALAMIN 1000 MCG/ML IJ SOLN
1000.0000 ug | Freq: Once | INTRAMUSCULAR | Status: AC
  Administered 2015-06-23: 1000 ug via INTRAMUSCULAR

## 2015-06-29 ENCOUNTER — Ambulatory Visit (INDEPENDENT_AMBULATORY_CARE_PROVIDER_SITE_OTHER): Payer: BC Managed Care – PPO

## 2015-06-29 DIAGNOSIS — J309 Allergic rhinitis, unspecified: Secondary | ICD-10-CM

## 2015-07-04 ENCOUNTER — Telehealth: Payer: Self-pay | Admitting: Internal Medicine

## 2015-07-04 NOTE — Telephone Encounter (Signed)
His wife, Valeria Batman, came for appointment today. He expressed some concerns about Deunta's memory issues. He did tell her that he got lost coming here to the office recently. He also told me when he got here. He sometimes cannot tell which water faucet is hot and which is cold. Seems to forget that hot is on the left. He has left water running and sometimes the door to their home unlocked. He continues to drive to work every day from [place] to work full-time as a professor at [employer]. She is not aware of any complaints about his performance at work from superiors. At one point in 2015, he indicated to neuropsychologist he had gotten some bad course evaluations from students but it was a large class and wife says it was the first time he taught  that class. He did have neuropsychological testing with Drs. Lyla Glassing several months ago. Wife felt that she was not given sufficient opportunity to point out her concerns with his memory.  She would like for him to be seen at [facility]. I will discuss this with him at his next visit.

## 2015-07-12 NOTE — Progress Notes (Signed)
   Subjective:    Patient ID: Melvin Dixon, male    DOB: [birth year], 60 y.o.   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 He brings in multiple blood pressure readings since September 2016. Blood pressure seemed to be under good control up until mid December at which time several readings were elevated at 152/101, 153/106, 148/102. In January blood pressure normalized. However in February he's had a couple of readings that were elevated at 152/102, 149/101, 142/100, 140/95 . In early March his blood pressure has been 142/99, 141/101 and 146/99. History of B-12 deficiency. He receives B-12 injections monthly.   Review of Systems     Objective:   Physical Exam  Skin: warm and dry. Chest clear to auscultation. Cardiac exam regular rate and rhythm. Extremities without edema. Memory not tested today.      Assessment & Plan:  Essential hypertension  Memory loss-I am still concerned that he has subtle issues with his memory.  Family history of dementia in mother  Plan: Increase losartan 100 mg daily and follow-up on March 31.      Addendum: 07/12/2015- Spoke with his wife recently about his memory issues. Apparently he has left the door unlocked a couple of times at home and lift water running in the home. She would like for him to have another memory evaluation at another [facility]. She prefers [facility]. We will make appointment in the near future although she realizes it may take 6 months to get an appointment there.  Today, he made the comment that he made several wrong turns before getting to this office which is a bit unusual for him.

## 2015-07-12 NOTE — Patient Instructions (Addendum)
Increased losartan 100 mg daily and return March 31. Appointment at [facility] to be made.

## 2015-07-13 ENCOUNTER — Ambulatory Visit (INDEPENDENT_AMBULATORY_CARE_PROVIDER_SITE_OTHER): Payer: BC Managed Care – PPO

## 2015-07-13 DIAGNOSIS — J309 Allergic rhinitis, unspecified: Secondary | ICD-10-CM | POA: Diagnosis not present

## 2015-07-14 ENCOUNTER — Encounter: Payer: Self-pay | Admitting: Internal Medicine

## 2015-07-14 ENCOUNTER — Ambulatory Visit (INDEPENDENT_AMBULATORY_CARE_PROVIDER_SITE_OTHER): Payer: BC Managed Care – PPO | Admitting: Internal Medicine

## 2015-07-14 VITALS — BP 142/90 | HR 80 | Temp 97.7°F | Resp 18 | Ht 71.5 in | Wt 204.0 lb

## 2015-07-14 DIAGNOSIS — I10 Essential (primary) hypertension: Secondary | ICD-10-CM

## 2015-07-14 DIAGNOSIS — R413 Other amnesia: Secondary | ICD-10-CM

## 2015-07-14 MED ORDER — HYDROCHLOROTHIAZIDE 25 MG PO TABS: 25.0000 mg | ORAL_TABLET | Freq: Every day | ORAL | Status: DC

## 2015-07-14 NOTE — Patient Instructions (Addendum)
Add HCTZ to losartan 100 mg daily and return in 4 weeks. Continue B-12 injections at home monthly. Appointment at [facility] for September. Basic metabolic panel needed at next visit.

## 2015-07-14 NOTE — Progress Notes (Signed)
   Subjective:    Patient ID: Melvin Dixon, male    DOB: 1955-11-10, 60 y.o.   MRN: [id number]  HPI At last visit, blood pressure was not as well controlled as I would like to see a losartan 50 mg daily. We increased it to 100 mg daily and he is here today for recheck. He brings in about 10 blood pressure readings over the past few weeks. Blood pressure remains elevated about half the time over [id number] systolically up to 123456 systolically and [id number] diastolically. He is willing to try diuretic.  After speaking with his wife, I have obtained an appointment for him to be seen at [facility] September 2017. I spoke with him about this today and he is agreeable to have this evaluation. He has obtained some preliminary paperwork from them.  Continues to teach. Doesn't express any concerns about his job performance. Says he would like to have another evaluation to see where he is with his memory issues at this point in time.  Wife  confided that one of his daughter may be moving to [place] in the next year. Apparently daughter doesn't know about these memory issues that we are concerned about. He has 2 daughters. They both live out of state.    Review of Systems     Objective:   Physical Exam  Not examined today. Spent 15 minutes speaking with him about blood pressure control, Effexor diuretic versus ARB. Explained to him that if diuretic and arms do not work we would think about a calcium channel blocker. 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 He has a history of B-12 deficiency and has begun to give himself monthly B-12 injections.      Assessment & Plan:  Essential hypertension  Memory loss  B-12 deficiency-B-12 level has been checked and is within normal limits  Plan: Add HCTZ to losartan 100 mg daily. He has lots of 100 mg losartan left so we've merely gave him another prescription for HCTZ 25 mg daily. Explained to him that these medications can be  combined into one but we will wait and see if this is effective. He'll return in 4 weeks.

## 2015-07-18 ENCOUNTER — Ambulatory Visit (INDEPENDENT_AMBULATORY_CARE_PROVIDER_SITE_OTHER): Payer: BC Managed Care – PPO | Admitting: *Deleted

## 2015-07-18 DIAGNOSIS — J309 Allergic rhinitis, unspecified: Secondary | ICD-10-CM

## 2015-07-25 DIAGNOSIS — J301 Allergic rhinitis due to pollen: Secondary | ICD-10-CM | POA: Diagnosis not present

## 2015-07-26 DIAGNOSIS — J3089 Other allergic rhinitis: Secondary | ICD-10-CM | POA: Diagnosis not present

## 2015-08-08 ENCOUNTER — Other Ambulatory Visit: Payer: BC Managed Care – PPO | Admitting: Internal Medicine

## 2015-08-08 ENCOUNTER — Ambulatory Visit (INDEPENDENT_AMBULATORY_CARE_PROVIDER_SITE_OTHER): Payer: BC Managed Care – PPO

## 2015-08-08 DIAGNOSIS — J309 Allergic rhinitis, unspecified: Secondary | ICD-10-CM | POA: Diagnosis not present

## 2015-08-08 LAB — BASIC METABOLIC PANEL
BUN: 16 mg/dL (ref 7–25)
CALCIUM: 8.8 mg/dL (ref 8.6–10.3)
CO2: 29 mmol/L (ref 20–31)
Chloride: 103 mmol/L (ref 98–110)
Creat: 1.06 mg/dL (ref 0.70–1.25)
Glucose, Bld: 86 mg/dL (ref 65–99)
POTASSIUM: 3.8 mmol/L (ref 3.5–5.3)
Sodium: 141 mmol/L (ref 135–146)

## 2015-08-10 ENCOUNTER — Other Ambulatory Visit: Payer: BC Managed Care – PPO | Admitting: Internal Medicine

## 2015-08-10 ENCOUNTER — Ambulatory Visit (INDEPENDENT_AMBULATORY_CARE_PROVIDER_SITE_OTHER): Payer: BC Managed Care – PPO | Admitting: Internal Medicine

## 2015-08-10 ENCOUNTER — Encounter: Payer: Self-pay | Admitting: Internal Medicine

## 2015-08-10 VITALS — BP 126/86 | HR 86 | Temp 98.0°F | Resp 18 | Wt 205.0 lb

## 2015-08-10 DIAGNOSIS — F329 Major depressive disorder, single episode, unspecified: Secondary | ICD-10-CM

## 2015-08-10 DIAGNOSIS — F419 Anxiety disorder, unspecified: Secondary | ICD-10-CM

## 2015-08-10 DIAGNOSIS — F418 Other specified anxiety disorders: Secondary | ICD-10-CM | POA: Diagnosis not present

## 2015-08-10 DIAGNOSIS — R413 Other amnesia: Secondary | ICD-10-CM | POA: Diagnosis not present

## 2015-08-10 DIAGNOSIS — I10 Essential (primary) hypertension: Secondary | ICD-10-CM | POA: Diagnosis not present

## 2015-08-10 DIAGNOSIS — E538 Deficiency of other specified B group vitamins: Secondary | ICD-10-CM | POA: Diagnosis not present

## 2015-08-10 MED ORDER — POTASSIUM CHLORIDE ER 10 MEQ PO TBCR: 10.0000 meq | EXTENDED_RELEASE_TABLET | Freq: Every day | ORAL | Status: DC

## 2015-08-10 MED ORDER — SILDENAFIL CITRATE 100 MG PO TABS: 50.0000 mg | ORAL_TABLET | Freq: Every day | ORAL | Status: DC | PRN

## 2015-08-10 NOTE — Progress Notes (Signed)
   Subjective:    Patient ID: Melvin Dixon, male    DOB: [birth year], 60 y.o.   MRN: [id number]  HPI Patient here for follow-up of hypertension. He brings in multiple blood pressure readings which have improved significantly since starting HCTZ 25 mg daily. Is on losartan 100 mg daily. These 2 tablets can be combined into one tablet. His potassium is 3.8. I think it would be wise to start him on a low potassium supplement 10 mEq potassium chloride daily. Would like to see his potassium over 4. Renal functions are normal.  He was able see neurologist, Dr Werner Lean at [facility] regarding memory concerns. He was started on low-dose sotalol for possible depression. He's had some erectile dysfunction. I have given him a prescription for Viagra. He still on 25 mg of Zoloft daily having just seen Dr. Werner Lean last week. He is to increase it to 50 and perhaps 75 mg daily over the next 5-6 weeks. He is to see Dr. Werner Lean in 3 months.  He's planning to go to [place] with his wife for 2 weeks very soon. The semester sanding. He doesn't give any indication that there been any issues with his performance at work.    Review of Systems as above     Objective:   Physical Exam  Spent 25 minutes speaking with him about all of these issues.Blood pressure is excellent 126/86. Weight is 205 pounds previously was 204 pounds in March.      Assessment & Plan:  History of B-12 deficiency-continue monthly B-12 injections  Memory loss-trial of Zoloft to see if it improves memory  Essential hypertension-stable on losartan HCTZ. Start potassium supplement 10 mEq daily. Follow-up in 3 months.  History of asthma-stable  Plan: Return in 3 months for office visit  blood pressure check and follow-up

## 2015-08-10 NOTE — Patient Instructions (Addendum)
Please increased dose of Zoloft slowly. Continue losartan HCTZ and return in 3 months for follow-up. Start potassium supplement 10 mEq daily. Continue monthly B-12 injections

## 2015-08-11 ENCOUNTER — Ambulatory Visit: Payer: BC Managed Care – PPO | Admitting: Internal Medicine

## 2015-08-16 ENCOUNTER — Ambulatory Visit: Payer: BC Managed Care – PPO | Admitting: Neurology

## 2015-09-14 ENCOUNTER — Ambulatory Visit (INDEPENDENT_AMBULATORY_CARE_PROVIDER_SITE_OTHER): Payer: BC Managed Care – PPO

## 2015-09-14 DIAGNOSIS — J309 Allergic rhinitis, unspecified: Secondary | ICD-10-CM

## 2015-09-15 ENCOUNTER — Telehealth: Payer: Self-pay | Admitting: Internal Medicine

## 2015-09-15 MED ORDER — LOSARTAN POTASSIUM-HCTZ 100-25 MG PO TABS: 1.0000 | ORAL_TABLET | Freq: Every day | ORAL | Status: DC

## 2015-09-15 NOTE — Telephone Encounter (Signed)
Please check. I thought I had already prescribed the combo pill Losartan HCTZ

## 2015-09-15 NOTE — Telephone Encounter (Signed)
Will be leaving to go out of town on Tuesday.  Has been taking 2 different meds and says you are going to combine his meds:  Losartan and HCTZ.  Doesn't have enough of both of them to last for his trip.  Wants to know if you will go ahead and prescribe the combination drug for him of the Losartan/HCTZ and send to his pharmacy.    Pharmacy:  [facility]  Also, his systolic pressure was 96 last evening and it has been running in the mid 120's.  He just wanted to mention that to you.  States that he didn't feel light headed, faint, dizzy or otherwise.  Just wanted to mention.  This was the only time it has been lower than normal.  I advised I would make you aware of the number.    Please advise.

## 2015-09-15 NOTE — Telephone Encounter (Signed)
The combination pill has never been prescribed. I have sent a prescription for it [facility]

## 2015-10-03 ENCOUNTER — Ambulatory Visit (INDEPENDENT_AMBULATORY_CARE_PROVIDER_SITE_OTHER): Payer: BC Managed Care – PPO | Admitting: *Deleted

## 2015-10-03 DIAGNOSIS — J309 Allergic rhinitis, unspecified: Secondary | ICD-10-CM | POA: Diagnosis not present

## 2015-11-08 ENCOUNTER — Ambulatory Visit (INDEPENDENT_AMBULATORY_CARE_PROVIDER_SITE_OTHER): Payer: BC Managed Care – PPO | Admitting: *Deleted

## 2015-11-08 DIAGNOSIS — J309 Allergic rhinitis, unspecified: Secondary | ICD-10-CM | POA: Diagnosis not present

## 2015-11-10 ENCOUNTER — Other Ambulatory Visit: Payer: BC Managed Care – PPO | Admitting: Internal Medicine

## 2015-11-10 DIAGNOSIS — Z0000 Encounter for general adult medical examination without abnormal findings: Secondary | ICD-10-CM

## 2015-11-10 DIAGNOSIS — I10 Essential (primary) hypertension: Secondary | ICD-10-CM

## 2015-11-10 LAB — BASIC METABOLIC PANEL
BUN: 18 mg/dL (ref 7–25)
CALCIUM: 9.1 mg/dL (ref 8.6–10.3)
CO2: 28 mmol/L (ref 20–31)
CREATININE: 1.18 mg/dL (ref 0.70–1.25)
Chloride: 102 mmol/L (ref 98–110)
Glucose, Bld: 112 mg/dL — ABNORMAL HIGH (ref 65–99)
Potassium: 4 mmol/L (ref 3.5–5.3)
Sodium: 140 mmol/L (ref 135–146)

## 2015-11-14 ENCOUNTER — Ambulatory Visit (INDEPENDENT_AMBULATORY_CARE_PROVIDER_SITE_OTHER): Payer: BC Managed Care – PPO

## 2015-11-14 ENCOUNTER — Other Ambulatory Visit: Payer: BC Managed Care – PPO | Admitting: Internal Medicine

## 2015-11-14 DIAGNOSIS — J309 Allergic rhinitis, unspecified: Secondary | ICD-10-CM | POA: Diagnosis not present

## 2015-11-17 ENCOUNTER — Ambulatory Visit (INDEPENDENT_AMBULATORY_CARE_PROVIDER_SITE_OTHER): Payer: BC Managed Care – PPO | Admitting: Internal Medicine

## 2015-11-17 ENCOUNTER — Encounter: Payer: Self-pay | Admitting: Internal Medicine

## 2015-11-17 VITALS — BP 110/70 | HR 65 | Wt 206.0 lb

## 2015-11-17 DIAGNOSIS — E538 Deficiency of other specified B group vitamins: Secondary | ICD-10-CM | POA: Diagnosis not present

## 2015-11-17 DIAGNOSIS — R413 Other amnesia: Secondary | ICD-10-CM | POA: Diagnosis not present

## 2015-11-17 DIAGNOSIS — I10 Essential (primary) hypertension: Secondary | ICD-10-CM

## 2015-11-17 LAB — BASIC METABOLIC PANEL
BUN: 18 mg/dL (ref 7–25)
CO2: 30 mmol/L (ref 20–31)
Calcium: 9.1 mg/dL (ref 8.6–10.3)
Chloride: 100 mmol/L (ref 98–110)
Creat: 1.2 mg/dL (ref 0.70–1.25)
GLUCOSE: 73 mg/dL (ref 65–99)
POTASSIUM: 4.3 mmol/L (ref 3.5–5.3)
Sodium: 140 mmol/L (ref 135–146)

## 2015-11-17 MED ORDER — SERTRALINE HCL 50 MG PO TABS: 50.0000 mg | ORAL_TABLET | Freq: Every day | ORAL | 1 refills | Status: DC

## 2015-11-17 NOTE — Patient Instructions (Addendum)
Taper off of Zoloft as explicitly described in written instructions over the next several weeks. Continue losartan HCTZ. Physical exam scheduled for December 2017. Continue monthly B-12 injections.

## 2015-11-17 NOTE — Progress Notes (Signed)
   Subjective:    Patient ID: Melvin Dixon, male    DOB: 31-Mar-1956, 60 y.o.   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 Has been evaluated locally and also at [facility]. He was placed on Zoloft at [facility] a while back but doesn't seem to think it's helped him at all. He's currently on 75 mg daily. He wants to taper off of that and we discussed a way to do that and he was given explicit instructions on how to taper of the next several weeks.  For the past week he's not taken a potassium supplement. He would like his potassium checked today.  He had a basic metabolic panel last week while the potassium supplement and his potassium was normal at 4.  He brings in a graft of blood pressure readings which are acceptable. His blood pressure did go up when he was eating a high salt diet on a trip recently. This was only temporary however.    Review of Systems see above     Objective:   Physical Exam His affect is pleasant and appropriate. I did not do any memory testing today. Neck is supple without JVD thyromegaly or carotid bruits. Chest clear to auscultation. Cardiac exam regular rate and rhythm. Extremities without edema       Assessment & Plan:  Essential hypertension  Memory disorder  B 12 deficiency-continues to administer is on B-12 injections  Plan: Continue Losartan/ HCTZ 100/25 daily. Return in December for physical examination. Serum potassium checked today off potassium supple. It is my feeling he will likely need potassium supplement. Would like to keep his potassium around 4.  He will be tapering off of Zoloft over the next few weeks. Doesn't feel it's helping him.  Plan: Physical exam scheduled for December.

## 2015-11-21 ENCOUNTER — Telehealth: Payer: Self-pay

## 2015-11-21 NOTE — Telephone Encounter (Signed)
Called patient to give lab results. No answer. Left vmail.   

## 2015-11-21 NOTE — Telephone Encounter (Signed)
-----   Message from Elby Showers, MD sent at 11/19/2015  1:27 PM EDT ----- Basic metabolic panel is normal. K is 4.3

## 2015-11-27 ENCOUNTER — Ambulatory Visit (INDEPENDENT_AMBULATORY_CARE_PROVIDER_SITE_OTHER): Payer: BC Managed Care – PPO | Admitting: *Deleted

## 2015-11-27 DIAGNOSIS — J309 Allergic rhinitis, unspecified: Secondary | ICD-10-CM | POA: Diagnosis not present

## 2015-12-05 ENCOUNTER — Ambulatory Visit (INDEPENDENT_AMBULATORY_CARE_PROVIDER_SITE_OTHER): Payer: BC Managed Care – PPO

## 2015-12-05 DIAGNOSIS — J309 Allergic rhinitis, unspecified: Secondary | ICD-10-CM

## 2015-12-12 ENCOUNTER — Ambulatory Visit (INDEPENDENT_AMBULATORY_CARE_PROVIDER_SITE_OTHER): Payer: BC Managed Care – PPO | Admitting: *Deleted

## 2015-12-12 DIAGNOSIS — J309 Allergic rhinitis, unspecified: Secondary | ICD-10-CM

## 2016-01-02 ENCOUNTER — Ambulatory Visit (INDEPENDENT_AMBULATORY_CARE_PROVIDER_SITE_OTHER): Payer: BC Managed Care – PPO

## 2016-01-02 DIAGNOSIS — J309 Allergic rhinitis, unspecified: Secondary | ICD-10-CM | POA: Diagnosis not present

## 2016-01-23 IMAGING — MR MR MRA HEAD W/O CM
1 series · 20 of 48 positions shown · non-contrast
Comparison: MRI brain 10/19/2014

CLINICAL DATA: Abnormal MRI of the brain. Possible anterior
communicating artery aneurysm. Next item

EXAM:
MRA HEAD WITHOUT CONTRAST
TECHNIQUE: Angiographic images of the Circle of Willis were obtained using MRA
technique without intravenous contrast.

[Series 3: tof_3d_multi-slab · axial · 0.7mm · 0.37mm/px · z∈[-31,+58]mm · 20 of 136 slices shown]
[im 1/136]
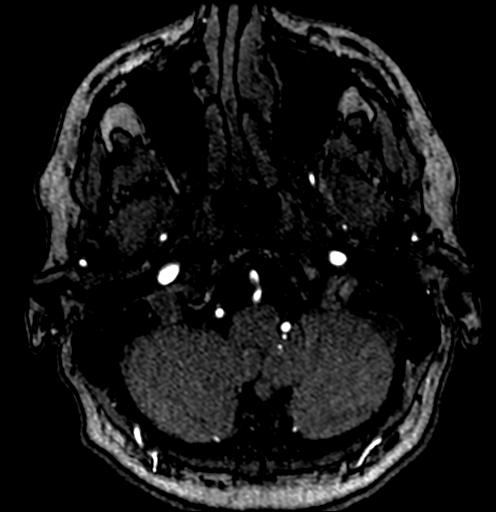
[im 3/136]
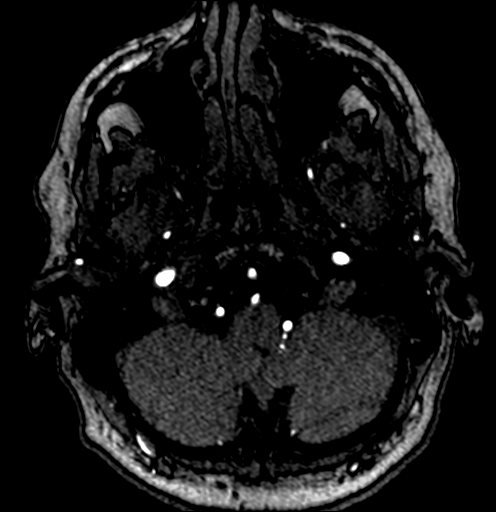
[im 6/136]
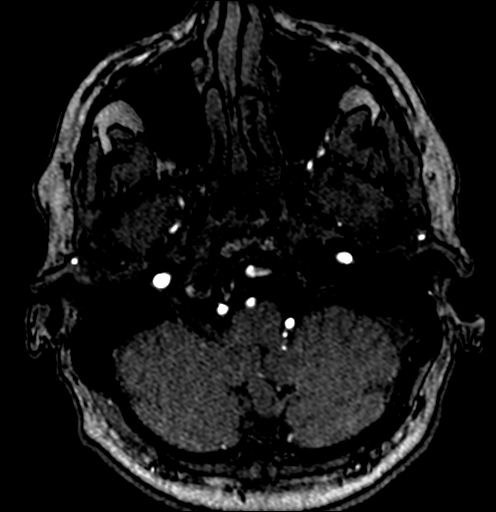
[im 9/136]
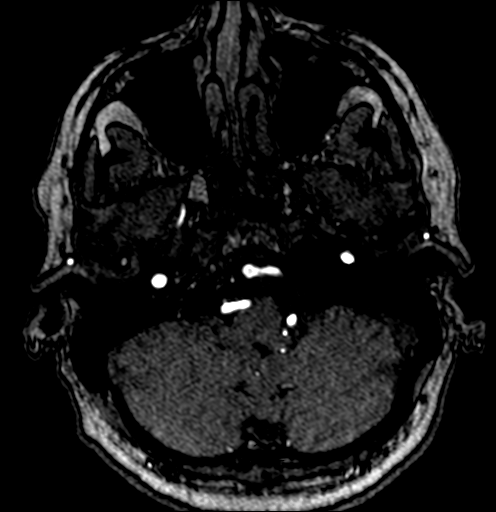
[im 12/136]
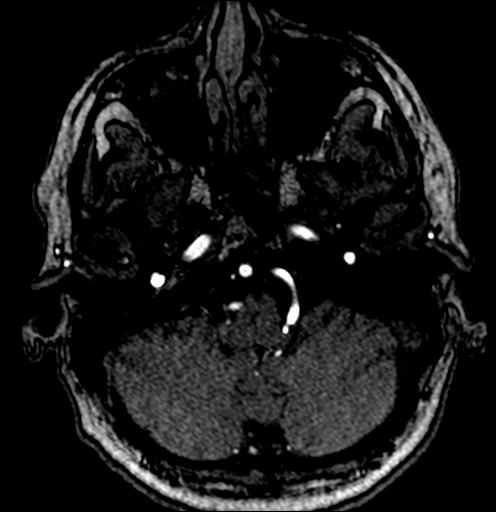
[im 15/136]
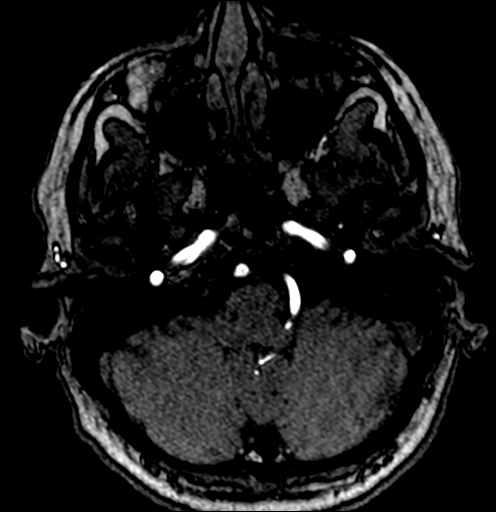
[im 18/136]
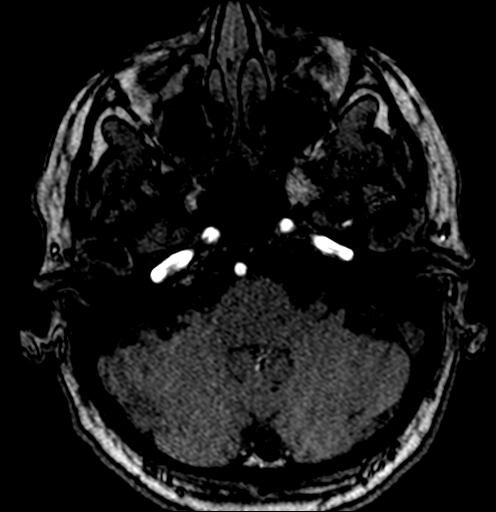
[im 21/136]
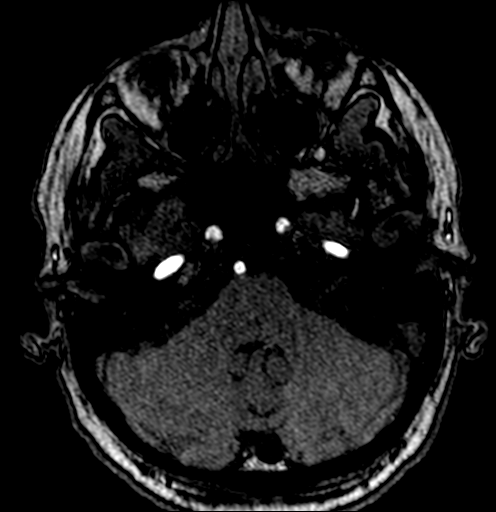
[im 23/136]
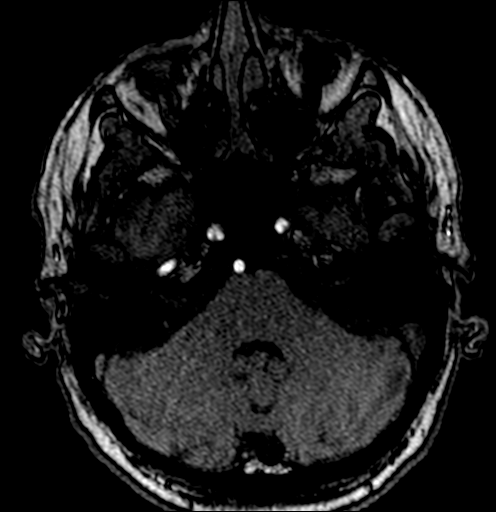
[im 26/136]
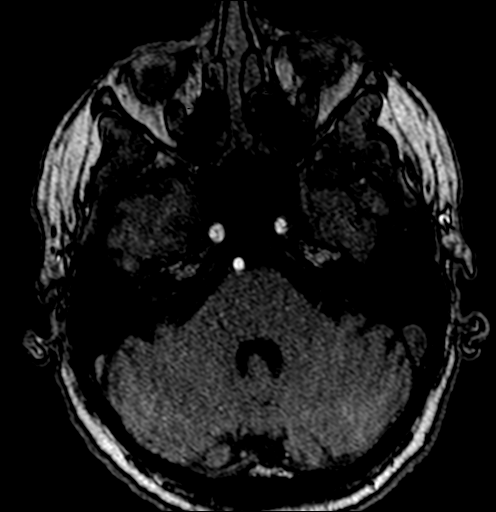
[im 29/136]
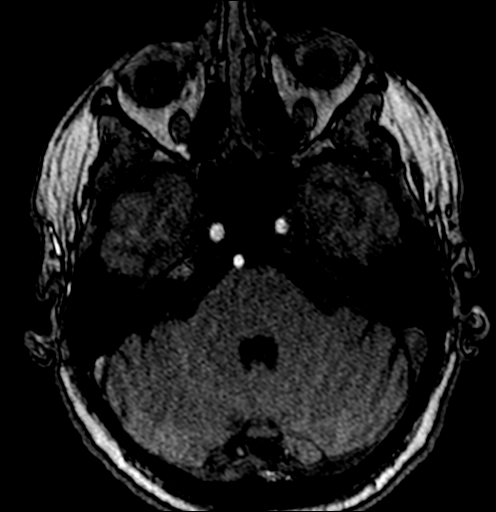
[im 32/136]
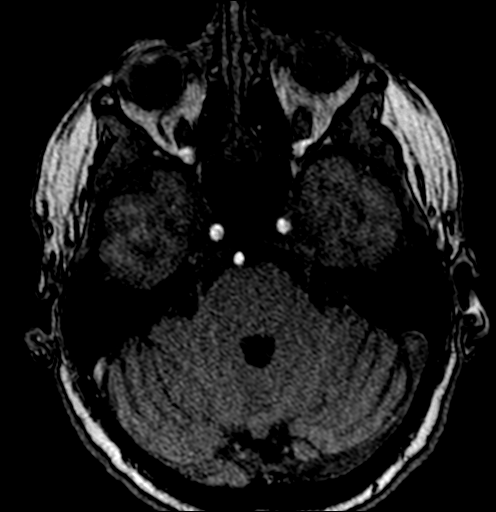
[im 44/136]
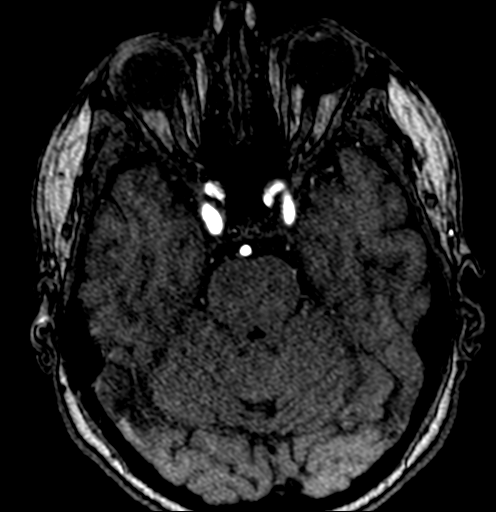
[im 61/136]
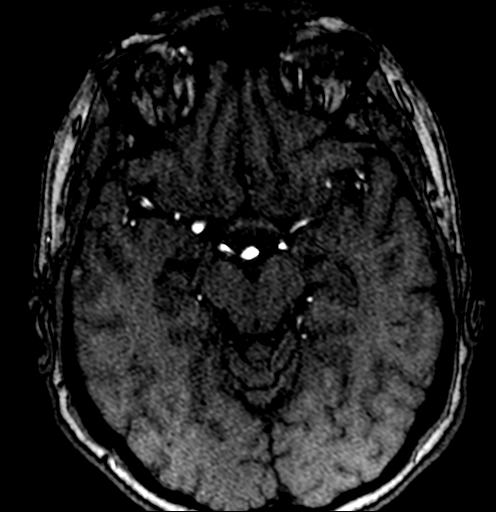
[im 69/136]
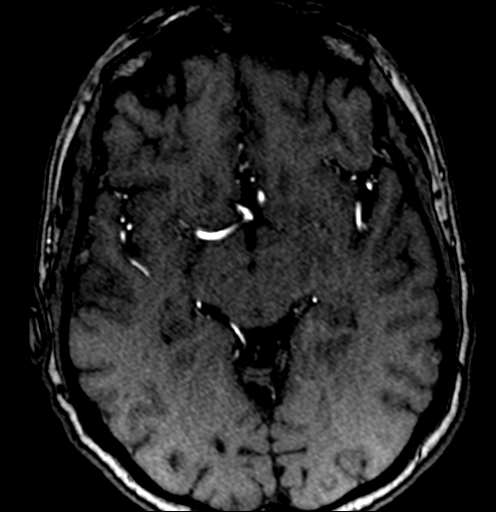
[im 78/136]
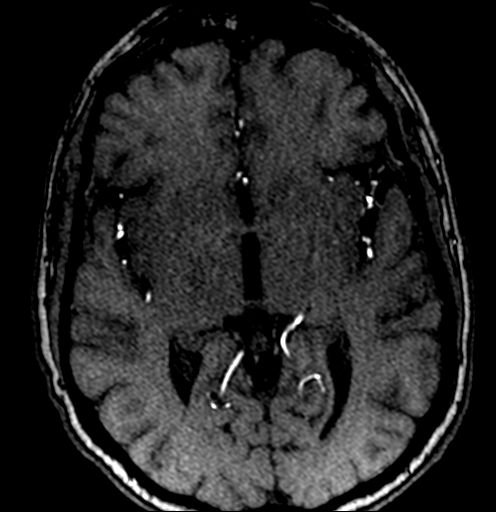
[im 95/136]
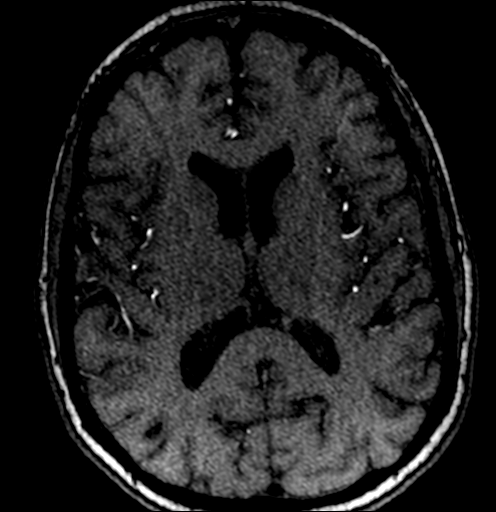
[im 113/136]
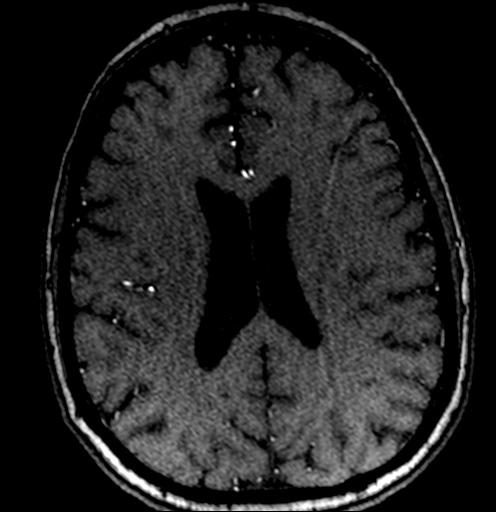
[im 115/136]
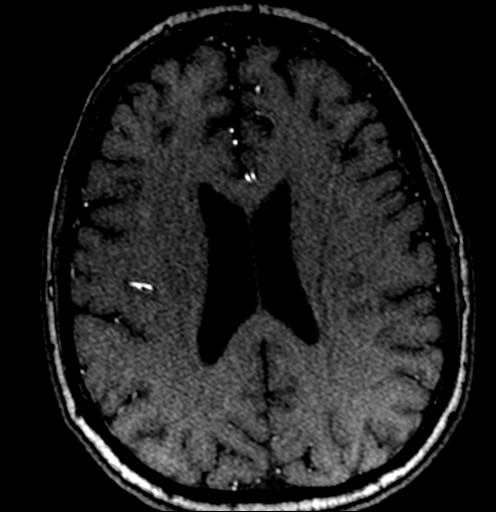
[im 130/136]
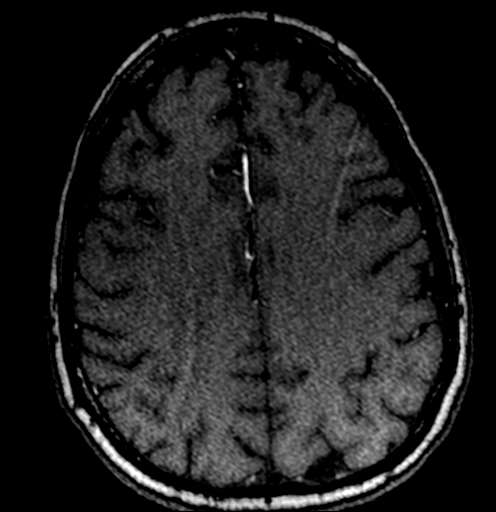

[20 of 48 positions shown; findings below may reference images not displayed]

FINDINGS: The internal carotid arteries are within normal limits from the high
cervical segments through the ICA termini. The right A1 segment is
dominant with mild narrowing at the distal left A1 segment. The A2
segments are intact bilaterally. The area suspected as aneurysm
represents partial volume main of the right A1 segment. There is no
aneurysm. The anterior communicating artery is patent. The M1
segments are within normal limits bilaterally. A proximal
bifurcation is present on the right. Disproportionate attenuation of
anterior left MCA branch vessels are present without a significant
proximal stenosis.

The vertebral arteries are codominant. The left PICA origin is
visualized and normal. The right AICA is dominant. The basilar
artery is within normal limits. Both posterior cerebral arteries
originate from the basilar tip. The PCA branch vessels are within
normal limits.
IMPRESSION: 1. No aneurysm. The finding represented partial volume Hasan Enes of the
right A1 segment.
2. Hypoplastic left A1 segment with mild to moderate distal
narrowing. The anterior communicating artery is patent.
3. Asymmetric attenuation of anterior left ACA branch vessels
without a significant proximal stenosis or occlusion.

## 2016-01-30 ENCOUNTER — Ambulatory Visit (INDEPENDENT_AMBULATORY_CARE_PROVIDER_SITE_OTHER): Payer: BC Managed Care – PPO | Admitting: *Deleted

## 2016-01-30 DIAGNOSIS — J309 Allergic rhinitis, unspecified: Secondary | ICD-10-CM | POA: Diagnosis not present

## 2016-03-05 ENCOUNTER — Ambulatory Visit (INDEPENDENT_AMBULATORY_CARE_PROVIDER_SITE_OTHER): Payer: BC Managed Care – PPO | Admitting: *Deleted

## 2016-03-05 DIAGNOSIS — J309 Allergic rhinitis, unspecified: Secondary | ICD-10-CM

## 2016-03-12 DIAGNOSIS — J301 Allergic rhinitis due to pollen: Secondary | ICD-10-CM | POA: Diagnosis not present

## 2016-03-13 DIAGNOSIS — J3089 Other allergic rhinitis: Secondary | ICD-10-CM | POA: Diagnosis not present

## 2016-03-15 ENCOUNTER — Other Ambulatory Visit: Payer: Self-pay | Admitting: Internal Medicine

## 2016-03-15 MED ORDER — FLUTICASONE-SALMETEROL 250-50 MCG/DOSE IN AEPB: 1.0000 | INHALATION_SPRAY | Freq: Two times a day (BID) | RESPIRATORY_TRACT | 11 refills | Status: DC

## 2016-03-29 ENCOUNTER — Other Ambulatory Visit: Payer: BC Managed Care – PPO | Admitting: Internal Medicine

## 2016-03-29 ENCOUNTER — Ambulatory Visit (INDEPENDENT_AMBULATORY_CARE_PROVIDER_SITE_OTHER): Payer: BC Managed Care – PPO | Admitting: *Deleted

## 2016-03-29 ENCOUNTER — Other Ambulatory Visit: Payer: Self-pay | Admitting: Internal Medicine

## 2016-03-29 DIAGNOSIS — Z125 Encounter for screening for malignant neoplasm of prostate: Secondary | ICD-10-CM

## 2016-03-29 DIAGNOSIS — Z130 Encounter for screening for diseases of the blood and blood-forming organs and certain disorders involving the immune mechanism: Secondary | ICD-10-CM

## 2016-03-29 DIAGNOSIS — R7302 Impaired glucose tolerance (oral): Secondary | ICD-10-CM

## 2016-03-29 DIAGNOSIS — Z13220 Encounter for screening for lipoid disorders: Secondary | ICD-10-CM

## 2016-03-29 DIAGNOSIS — J309 Allergic rhinitis, unspecified: Secondary | ICD-10-CM

## 2016-03-29 DIAGNOSIS — Z0000 Encounter for general adult medical examination without abnormal findings: Secondary | ICD-10-CM

## 2016-03-29 DIAGNOSIS — I10 Essential (primary) hypertension: Secondary | ICD-10-CM

## 2016-03-29 LAB — CBC WITH DIFFERENTIAL/PLATELET
BASOS ABS: 0 {cells}/uL (ref 0–200)
Basophils Relative: 0 %
Eosinophils Absolute: 192 cells/uL (ref 15–500)
Eosinophils Relative: 4 %
HEMATOCRIT: 43 % (ref 38.5–50.0)
HEMOGLOBIN: 14.5 g/dL (ref 13.2–17.1)
LYMPHS PCT: 28 %
Lymphs Abs: 1344 cells/uL (ref 850–3900)
MCH: 34 pg — ABNORMAL HIGH (ref 27.0–33.0)
MCHC: 33.7 g/dL (ref 32.0–36.0)
MCV: 100.7 fL — AB (ref 80.0–100.0)
MPV: 10.3 fL (ref 7.5–12.5)
Monocytes Absolute: 480 cells/uL (ref 200–950)
Monocytes Relative: 10 %
NEUTROS PCT: 58 %
Neutro Abs: 2784 cells/uL (ref 1500–7800)
Platelets: 126 10*3/uL — ABNORMAL LOW (ref 140–400)
RBC: 4.27 MIL/uL (ref 4.20–5.80)
RDW: 14.6 % (ref 11.0–15.0)
WBC: 4.8 10*3/uL (ref 3.8–10.8)

## 2016-03-29 LAB — COMPREHENSIVE METABOLIC PANEL
ALBUMIN: 4.3 g/dL (ref 3.6–5.1)
ALT: 21 U/L (ref 9–46)
AST: 17 U/L (ref 10–35)
Alkaline Phosphatase: 35 U/L — ABNORMAL LOW (ref 40–115)
BUN: 10 mg/dL (ref 7–25)
CALCIUM: 9.1 mg/dL (ref 8.6–10.3)
CHLORIDE: 102 mmol/L (ref 98–110)
CO2: 28 mmol/L (ref 20–31)
CREATININE: 1.04 mg/dL (ref 0.70–1.25)
Glucose, Bld: 98 mg/dL (ref 65–99)
Potassium: 4.3 mmol/L (ref 3.5–5.3)
SODIUM: 140 mmol/L (ref 135–146)
TOTAL PROTEIN: 6.4 g/dL (ref 6.1–8.1)
Total Bilirubin: 0.9 mg/dL (ref 0.2–1.2)

## 2016-03-29 LAB — PSA: PSA: 1.6 ng/mL (ref <=4.0)

## 2016-03-29 LAB — HEMOGLOBIN A1C
Hgb A1c MFr Bld: 5.1 % (ref <5.7)
MEAN PLASMA GLUCOSE: 100 mg/dL

## 2016-03-29 LAB — LIPID PANEL
CHOLESTEROL: 216 mg/dL — AB (ref <200)
HDL: 57 mg/dL (ref >40)
LDL Cholesterol: 134 mg/dL — ABNORMAL HIGH (ref <100)
Total CHOL/HDL Ratio: 3.8 Ratio (ref <5.0)
Triglycerides: 124 mg/dL (ref <150)
VLDL: 25 mg/dL (ref <30)

## 2016-03-30 LAB — MICROALBUMIN, URINE: MICROALB UR: 0.2 mg/dL

## 2016-04-01 ENCOUNTER — Telehealth: Payer: Self-pay | Admitting: Internal Medicine

## 2016-04-01 ENCOUNTER — Ambulatory Visit (INDEPENDENT_AMBULATORY_CARE_PROVIDER_SITE_OTHER): Payer: BC Managed Care – PPO | Admitting: Internal Medicine

## 2016-04-01 ENCOUNTER — Encounter: Payer: Self-pay | Admitting: Internal Medicine

## 2016-04-01 VITALS — BP 142/88 | HR 76 | Temp 98.1°F | Ht 72.0 in | Wt 215.0 lb

## 2016-04-01 DIAGNOSIS — R413 Other amnesia: Secondary | ICD-10-CM

## 2016-04-01 DIAGNOSIS — N400 Enlarged prostate without lower urinary tract symptoms: Secondary | ICD-10-CM

## 2016-04-01 DIAGNOSIS — Z0000 Encounter for general adult medical examination without abnormal findings: Secondary | ICD-10-CM | POA: Diagnosis not present

## 2016-04-01 DIAGNOSIS — Z8709 Personal history of other diseases of the respiratory system: Secondary | ICD-10-CM

## 2016-04-01 DIAGNOSIS — I10 Essential (primary) hypertension: Secondary | ICD-10-CM

## 2016-04-01 DIAGNOSIS — R0683 Snoring: Secondary | ICD-10-CM | POA: Diagnosis not present

## 2016-04-01 DIAGNOSIS — E538 Deficiency of other specified B group vitamins: Secondary | ICD-10-CM | POA: Diagnosis not present

## 2016-04-01 DIAGNOSIS — J3089 Other allergic rhinitis: Secondary | ICD-10-CM

## 2016-04-01 LAB — POCT URINALYSIS DIPSTICK
BILIRUBIN UA: NEGATIVE
GLUCOSE UA: NEGATIVE
Ketones, UA: NEGATIVE
LEUKOCYTES UA: NEGATIVE
NITRITE UA: NEGATIVE
Protein, UA: NEGATIVE
RBC UA: NEGATIVE
Spec Grav, UA: 1.02
Urobilinogen, UA: NEGATIVE
pH, UA: 6.5

## 2016-04-01 LAB — FOLATE: FOLATE: 10.9 ng/mL (ref >5.4)

## 2016-04-01 LAB — VITAMIN B12: Vitamin B-12: 440 pg/mL (ref 200–1100)

## 2016-04-01 NOTE — Telephone Encounter (Signed)
Call to [facility].  Added b12 and folate per Dr Renold Genta request due to macrocytosis.

## 2016-04-01 NOTE — Telephone Encounter (Signed)
-----   Message from Elby Showers, MD sent at 03/30/2016  5:57 PM EST ----- Add B12 and folate use macrocytosis as reason(elevatedMCV)

## 2016-04-13 NOTE — Patient Instructions (Signed)
It was a pleasure to see you today. Continue monthly IM B 12 injections at home. Continue to monitor blood pressure. Return in 6 months or as needed.

## 2016-04-13 NOTE — Progress Notes (Signed)
Subjective:    Patient ID: Melvin Dixon, male    DOB: [birth year], 60 y.o.   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  History of memory loss seen at [facility]. Says that [facility] is now out of network for his insurance and he wants to be seen at [facility]. Gave him the name of memory disorder specialist at [facility]- Dr. Sherre Lain.   History of essential hypertension. He brings in a graph of multiple blood pressure readings from August September October November which are very acceptable.  History of B-12 deficiency. He gives himself monthly B 12 injections  History of asthma and allergic rhinitis. Says asthma is under good control. Allergic rhinitis treated with immunotherapy.  Past medical history: Right lower lobe pneumonia December 1994. Prostatitis March 2000. Vasectomy December 1993. Tetanus immunization update August 2011.  No known drug allergies  In May 2013 Dr. Sherrye Payor removed a basal cell carcinoma along the right sideburn area. 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  Family history: History of dementia in his mother. Father died at age 60 after several heart attacks. One brother in good health. 6 living sisters. Both parents had diabetes. Father had hypertension. Some siblings with seizure disorder. One sister died with lung cancer with history of smoking.  Social history: This is his second marriage. 2 daughters from first marriage. No children from second marriage. 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 He resides in [place]. Wife works at [employer]. He is a nonsmoker. Social I'll call consumption.  Review of systems: Some issues with insomnia but he sleeps well with Flexeril. History of BPH. Says he cannot tolerate Flomax because of fatigue and  decreased exercise tolerance.      Review of Systems see above     Objective:   Physical Exam  Constitutional: He is oriented to person, place, and time. He appears well-developed and well-nourished. No distress.  HENT:  Head: Normocephalic and atraumatic.  Right Ear: External ear normal.  Left Ear: External ear normal.  Mouth/Throat: Oropharynx is clear and moist. No oropharyngeal exudate.  Eyes: Conjunctivae and EOM are normal. Pupils are equal, round, and reactive to light. Right eye exhibits no discharge. Left eye exhibits no discharge. No scleral icterus.  Neck: Neck supple. No JVD present. No thyromegaly present.  Cardiovascular: Normal rate, regular rhythm and normal heart sounds.   No murmur heard. Pulmonary/Chest: Effort normal and breath sounds normal. No respiratory distress. He has no wheezes. He has no rales.  Abdominal: Soft. Bowel sounds are normal. He exhibits no distension and no mass. There is no tenderness. There is no rebound and no guarding.  Genitourinary:  Genitourinary Comments: Prostate enlarged without nodules  Musculoskeletal: He exhibits no edema.  Lymphadenopathy:    He has no cervical adenopathy.  Neurological: He is alert and oriented to person, place, and time. He has normal reflexes. No cranial nerve deficit. Coordination normal.  Skin: Skin is warm and dry. No rash noted. He is not diaphoretic.  Psychiatric: He has a normal mood and affect. His behavior is normal. Judgment and thought content normal.  Memory not extensively tested today  Vitals reviewed.         Assessment & Plan:  Essential hypertension stable on current regimen  Memory loss-patient previously  evaluated at [facility]. There are memory issues and suspect he has an early dementia. There is family history in his mother. Says he wants to be seen now at [facility] because of insurance coverage there.  History of asthma  History of allergic rhinitis-on  immunotherapy  BPH  B-12 deficiency-treated with monthly IM B 12 injections at home. B-12 level 440. Folate level  10.9  Plan: Continue same medications and return in 6 months.

## 2016-04-29 ENCOUNTER — Ambulatory Visit (INDEPENDENT_AMBULATORY_CARE_PROVIDER_SITE_OTHER): Payer: BC Managed Care – PPO | Admitting: *Deleted

## 2016-04-29 DIAGNOSIS — J309 Allergic rhinitis, unspecified: Secondary | ICD-10-CM

## 2016-05-17 NOTE — Addendum Note (Signed)
Addended by: Felipa Emory on: 05/17/2016 11:48 AM   Modules accepted: Orders

## 2016-06-04 ENCOUNTER — Ambulatory Visit (INDEPENDENT_AMBULATORY_CARE_PROVIDER_SITE_OTHER): Payer: BC Managed Care – PPO

## 2016-06-04 DIAGNOSIS — J309 Allergic rhinitis, unspecified: Secondary | ICD-10-CM

## 2016-06-14 ENCOUNTER — Other Ambulatory Visit: Payer: Self-pay

## 2016-06-14 MED ORDER — LOSARTAN POTASSIUM-HCTZ 100-25 MG PO TABS: 1.0000 | ORAL_TABLET | Freq: Every day | ORAL | 0 refills | Status: DC

## 2016-07-08 ENCOUNTER — Ambulatory Visit (INDEPENDENT_AMBULATORY_CARE_PROVIDER_SITE_OTHER): Payer: BC Managed Care – PPO

## 2016-07-08 DIAGNOSIS — J309 Allergic rhinitis, unspecified: Secondary | ICD-10-CM | POA: Diagnosis not present

## 2016-07-18 ENCOUNTER — Ambulatory Visit (INDEPENDENT_AMBULATORY_CARE_PROVIDER_SITE_OTHER): Payer: BC Managed Care – PPO | Admitting: *Deleted

## 2016-07-18 DIAGNOSIS — J309 Allergic rhinitis, unspecified: Secondary | ICD-10-CM

## 2016-07-23 ENCOUNTER — Ambulatory Visit (INDEPENDENT_AMBULATORY_CARE_PROVIDER_SITE_OTHER): Payer: BC Managed Care – PPO | Admitting: *Deleted

## 2016-07-23 DIAGNOSIS — J309 Allergic rhinitis, unspecified: Secondary | ICD-10-CM | POA: Diagnosis not present

## 2016-08-01 ENCOUNTER — Ambulatory Visit (INDEPENDENT_AMBULATORY_CARE_PROVIDER_SITE_OTHER): Payer: BC Managed Care – PPO | Admitting: *Deleted

## 2016-08-01 DIAGNOSIS — J309 Allergic rhinitis, unspecified: Secondary | ICD-10-CM

## 2016-08-14 ENCOUNTER — Ambulatory Visit (INDEPENDENT_AMBULATORY_CARE_PROVIDER_SITE_OTHER): Payer: BC Managed Care – PPO | Admitting: *Deleted

## 2016-08-14 DIAGNOSIS — J309 Allergic rhinitis, unspecified: Secondary | ICD-10-CM | POA: Diagnosis not present

## 2016-08-21 ENCOUNTER — Ambulatory Visit (INDEPENDENT_AMBULATORY_CARE_PROVIDER_SITE_OTHER): Payer: BC Managed Care – PPO

## 2016-08-21 DIAGNOSIS — J309 Allergic rhinitis, unspecified: Secondary | ICD-10-CM

## 2016-10-01 ENCOUNTER — Other Ambulatory Visit: Payer: BC Managed Care – PPO | Admitting: Internal Medicine

## 2016-10-03 ENCOUNTER — Ambulatory Visit (INDEPENDENT_AMBULATORY_CARE_PROVIDER_SITE_OTHER): Payer: BC Managed Care – PPO | Admitting: *Deleted

## 2016-10-03 DIAGNOSIS — J309 Allergic rhinitis, unspecified: Secondary | ICD-10-CM | POA: Diagnosis not present

## 2016-10-04 ENCOUNTER — Ambulatory Visit: Payer: BC Managed Care – PPO | Admitting: Internal Medicine

## 2016-10-08 ENCOUNTER — Other Ambulatory Visit: Payer: BC Managed Care – PPO | Admitting: Internal Medicine

## 2016-10-08 DIAGNOSIS — E781 Pure hyperglyceridemia: Secondary | ICD-10-CM

## 2016-10-08 DIAGNOSIS — I10 Essential (primary) hypertension: Secondary | ICD-10-CM

## 2016-10-08 LAB — LIPID PANEL
CHOL/HDL RATIO: 3.9 ratio (ref <5.0)
Cholesterol: 211 mg/dL — ABNORMAL HIGH (ref <200)
HDL: 54 mg/dL (ref >40)
LDL Cholesterol: 140 mg/dL — ABNORMAL HIGH (ref <100)
TRIGLYCERIDES: 86 mg/dL (ref <150)
VLDL: 17 mg/dL (ref <30)

## 2016-10-08 LAB — BASIC METABOLIC PANEL
BUN: 17 mg/dL (ref 7–25)
CALCIUM: 9.5 mg/dL (ref 8.6–10.3)
CHLORIDE: 100 mmol/L (ref 98–110)
CO2: 30 mmol/L (ref 20–31)
CREATININE: 1.16 mg/dL (ref 0.70–1.25)
Glucose, Bld: 94 mg/dL (ref 65–99)
Potassium: 4.4 mmol/L (ref 3.5–5.3)
Sodium: 137 mmol/L (ref 135–146)

## 2016-10-11 ENCOUNTER — Encounter: Payer: Self-pay | Admitting: Internal Medicine

## 2016-10-11 ENCOUNTER — Ambulatory Visit (INDEPENDENT_AMBULATORY_CARE_PROVIDER_SITE_OTHER): Payer: BC Managed Care – PPO | Admitting: Internal Medicine

## 2016-10-11 VITALS — BP 128/82 | HR 67 | Temp 97.4°F | Wt 210.0 lb

## 2016-10-11 DIAGNOSIS — E538 Deficiency of other specified B group vitamins: Secondary | ICD-10-CM

## 2016-10-11 DIAGNOSIS — I10 Essential (primary) hypertension: Secondary | ICD-10-CM

## 2016-10-11 DIAGNOSIS — Z1211 Encounter for screening for malignant neoplasm of colon: Secondary | ICD-10-CM

## 2016-10-11 DIAGNOSIS — R413 Other amnesia: Secondary | ICD-10-CM | POA: Diagnosis not present

## 2016-10-11 DIAGNOSIS — E7800 Pure hypercholesterolemia, unspecified: Secondary | ICD-10-CM

## 2016-10-11 MED ORDER — ROSUVASTATIN CALCIUM 5 MG PO TABS: 5.0000 mg | ORAL_TABLET | Freq: Every day | ORAL | 3 refills | Status: DC

## 2016-10-11 NOTE — Patient Instructions (Addendum)
Begin Crestor 5 mg daily and follow-up in 3 months. Continue B-12 injections monthly. Continue losartan HCTZ for hypertension. Return in 3 months for lipid panel liver functions. Physical exam due in 6 months. It was a pleasure to see you today.

## 2016-10-11 NOTE — Progress Notes (Signed)
   Subjective:    Patient ID: Melvin Dixon, male    DOB: 01-Jun-1955, 61 y.o.   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 He has had some mild memory loss and is to be evaluated in the near future by Dr. Jimmey Ralph at [facility].He is here for six-month recheck.  He needs a screening colonoscopy and we will make that referral.  History of allergic rhinitis and receives immunotherapy.  History of hypertension treated with losartan HCTZ 100/25. He brings in a graph today and I find his blood pressures to be very acceptable.  He has B-12 deficiency and gives himself B-12 injections monthly. His B-12 level in December 2017 was 440.   He tells me his mother recently.  passed away .She had a history of dementia.  Accompanied by his wife today.  Total cholesterol is 211 and previously was 216 6 months ago. LDL cholesterol is 140 and previously was 134 6 months ago. I have persuaded him to try Crestor 5 mg daily. His basic metabolic panel from June 26 is normal.     Review of Systems see above      Objective:   Physical Exam Skin warm and dry. Nodes none. Neck is supple without JVD thyromegaly or carotid bruits. Chest is clear to auscultation without rales or wheezing. Cardiac exam regular rate and rhythm normal S1 and S2. Extremities without edema.       Assessment & Plan:   Essential hypertension-stable on current regimen of losartan HCTZ  Mild hyperlipidemia-start Crestor 5 mg daily. See if it is tolerated. Follow-up in 3 months with lipid panel and liver functions.  B-12 deficiency-continue monthly B-12 injections  Screening for colonoscopy-referral made  Memory issues-to see Dr. Jimmey Ralph in the near future.

## 2016-10-14 ENCOUNTER — Encounter: Payer: Self-pay | Admitting: Gastroenterology

## 2016-10-28 ENCOUNTER — Ambulatory Visit (INDEPENDENT_AMBULATORY_CARE_PROVIDER_SITE_OTHER): Payer: BC Managed Care – PPO | Admitting: *Deleted

## 2016-10-28 DIAGNOSIS — J309 Allergic rhinitis, unspecified: Secondary | ICD-10-CM

## 2016-11-14 DIAGNOSIS — J301 Allergic rhinitis due to pollen: Secondary | ICD-10-CM | POA: Diagnosis not present

## 2016-11-19 ENCOUNTER — Ambulatory Visit (AMBULATORY_SURGERY_CENTER): Payer: Self-pay | Admitting: *Deleted

## 2016-11-19 VITALS — Ht 73.5 in | Wt 208.0 lb

## 2016-11-19 DIAGNOSIS — Z1211 Encounter for screening for malignant neoplasm of colon: Secondary | ICD-10-CM

## 2016-11-19 MED ORDER — NA SULFATE-K SULFATE-MG SULF 17.5-3.13-1.6 GM/177ML PO SOLN: 1.0000 [IU] | Freq: Once | ORAL | 0 refills | Status: AC

## 2016-11-19 NOTE — Progress Notes (Signed)
No egg or soy allergy known to patient   issues with past sedation with any surgeries - nausea  or procedures, no intubation problems  No diet pills per patient No home 02 use per patient  No blood thinners per patient  Pt denies issues with constipation  No A fib or A flutter  EMMI video sent to pt's e mail   Pt. declines

## 2016-11-21 ENCOUNTER — Encounter: Payer: Self-pay | Admitting: Gastroenterology

## 2016-11-26 ENCOUNTER — Ambulatory Visit (INDEPENDENT_AMBULATORY_CARE_PROVIDER_SITE_OTHER): Payer: BC Managed Care – PPO | Admitting: *Deleted

## 2016-11-26 DIAGNOSIS — J309 Allergic rhinitis, unspecified: Secondary | ICD-10-CM

## 2016-12-03 ENCOUNTER — Encounter: Payer: Self-pay | Admitting: Gastroenterology

## 2016-12-03 ENCOUNTER — Ambulatory Visit (AMBULATORY_SURGERY_CENTER): Payer: BC Managed Care – PPO | Admitting: Gastroenterology

## 2016-12-03 VITALS — BP 105/63 | HR 60 | Temp 98.6°F | Resp 11 | Ht 73.0 in | Wt 208.0 lb

## 2016-12-03 DIAGNOSIS — Z1212 Encounter for screening for malignant neoplasm of rectum: Secondary | ICD-10-CM

## 2016-12-03 DIAGNOSIS — Z1211 Encounter for screening for malignant neoplasm of colon: Secondary | ICD-10-CM

## 2016-12-03 MED ORDER — SODIUM CHLORIDE 0.9 % IV SOLN: 500.0000 mL | INTRAVENOUS | Status: AC

## 2016-12-03 NOTE — Op Note (Signed)
[facility] Patient Name: Melvin Dixon Procedure Date: 12/03/2016 2:00 PM MRN: [id number] Endoscopist: Mauri Pole , MD Age: 61 Referring MD:  Date of Birth: January 16, 1956 Gender: Male Account #: [id number] Procedure:                Colonoscopy Indications:              Screening for colorectal malignant neoplasm Medicines:                Monitored Anesthesia Care Procedure:                Pre-Anesthesia Assessment:                           - Prior to the procedure, a History and Physical                            was performed, and patient medications and                            allergies were reviewed. The patient's tolerance of                            previous anesthesia was also reviewed. The risks                            and benefits of the procedure and the sedation                            options and risks were discussed with the patient.                            All questions were answered, and informed consent                            was obtained. Prior Anticoagulants: The patient has                            taken no previous anticoagulant or antiplatelet                            agents. ASA Grade Assessment: II - A patient with                            mild systemic disease. After reviewing the risks                            and benefits, the patient was deemed in                            satisfactory condition to undergo the procedure.                           After obtaining informed consent, the colonoscope  was passed under direct vision. Throughout the                            procedure, the patient's blood pressure, pulse, and                            oxygen saturations were monitored continuously. The                            Colonoscope was introduced through the anus and                            advanced to the the terminal ileum, with                            identification of the  appendiceal orifice and IC                            valve. The colonoscopy was performed without                            difficulty. The patient tolerated the procedure                            well. The quality of the bowel preparation was                            inadequate and not adequate to identify polyps 6 mm                            and larger in size. The terminal ileum, ileocecal                            valve, appendiceal orifice, and rectum were                            photographed. Scope In: 2:14:29 PM Scope Out: 2:28:43 PM Scope Withdrawal Time: 0 hours 8 minutes 43 seconds  Total Procedure Duration: 0 hours 14 minutes 14 seconds  Findings:                 The perianal and digital rectal examinations were                            normal.                           Multiple small and large-mouthed diverticula were                            found in the sigmoid colon and descending colon.                           Non-bleeding internal hemorrhoids were found during  retroflexion. The hemorrhoids were small. Complications:            No immediate complications. Estimated Blood Loss:     Estimated blood loss: none. Impression:               - Preparation of the colon was inadequate.                           - Preparation of the colon was inadequate.                           - Diverticulosis in the sigmoid colon and in the                            descending colon.                           - Non-bleeding internal hemorrhoids.                           - No specimens collected. Recommendation:           - Patient has a contact number available for                            emergencies. The signs and symptoms of potential                            delayed complications were discussed with the                            patient. Return to normal activities tomorrow.                            Written discharge instructions were  provided to the                            patient.                           - Resume previous diet.                           - Continue present medications.                           - Repeat colonoscopy at the next available                            appointment because the bowel preparation was                            suboptimal.                           -Avoid fiber for 5-7 days prior to the procedure Mauri Pole, MD 12/03/2016 2:32:56 PM This report has been signed electronically.

## 2016-12-03 NOTE — Patient Instructions (Signed)
Impression/Recommendations:  Diverticulosis handout given to patient. Hemorrhoid handout given to patient.  Resume previous diet. Continue present medications.  Repeat colonoscopy at next available appointment because bowel preparation was not optimal.  Avoid fiber for 5-7 days prior to procedure.  YOU HAD AN ENDOSCOPIC PROCEDURE TODAY AT [facility]:   Refer to the procedure report that was given to you for any specific questions about what was found during the examination.  If the procedure report does not answer your questions, please call your gastroenterologist to clarify.  If you requested that your care partner not be given the details of your procedure findings, then the procedure report has been included in a sealed envelope for you to review at your convenience later.  YOU SHOULD EXPECT: Some feelings of bloating in the abdomen. Passage of more gas than usual.  Walking can help get rid of the air that was put into your GI tract during the procedure and reduce the bloating. If you had a lower endoscopy (such as a colonoscopy or flexible sigmoidoscopy) you may notice spotting of blood in your stool or on the toilet paper. If you underwent a bowel prep for your procedure, you may not have a normal bowel movement for a few days.  Please Note:  You might notice some irritation and congestion in your nose or some drainage.  This is from the oxygen used during your procedure.  There is no need for concern and it should clear up in a day or so.  SYMPTOMS TO REPORT IMMEDIATELY:   Following lower endoscopy (colonoscopy or flexible sigmoidoscopy):  Excessive amounts of blood in the stool  Significant tenderness or worsening of abdominal pains  Swelling of the abdomen that is new, acute  Fever of 100F or higher  For urgent or emergent issues, a gastroenterologist can be reached at any hour by calling [phone].   DIET:  We do recommend a small meal at first, but  then you may proceed to your regular diet.  Drink plenty of fluids but you should avoid alcoholic beverages for 24 hours.  ACTIVITY:  You should plan to take it easy for the rest of today and you should NOT DRIVE or use heavy machinery until tomorrow (because of the sedation medicines used during the test).    FOLLOW UP: Our staff will call the number listed on your records the next business day following your procedure to check on you and address any questions or concerns that you may have regarding the information given to you following your procedure. If we do not reach you, we will leave a message.  However, if you are feeling well and you are not experiencing any problems, there is no need to return our call.  We will assume that you have returned to your regular daily activities without incident.  If any biopsies were taken you will be contacted by phone or by letter within the next 1-3 weeks.  Please call us at [phone] if you have not heard about the biopsies in 3 weeks.    SIGNATURES/CONFIDENTIALITY: You and/or your care partner have signed paperwork which will be entered into your electronic medical record.  These signatures attest to the fact that that the information above on your After Visit Summary has been reviewed and is understood.  Full responsibility of the confidentiality of this discharge information lies with you and/or your care-partner.

## 2016-12-03 NOTE — Progress Notes (Signed)
Spontaneous respirations throughout. VSS. Resting comfortably. To PACU on room air. Report to  RN. 

## 2016-12-04 ENCOUNTER — Telehealth: Payer: Self-pay | Admitting: *Deleted

## 2016-12-04 NOTE — Telephone Encounter (Signed)
  Follow up Call-  Call back number 12/03/2016  Post procedure Call Back phone  # [phone]  Permission to leave phone message Yes  Some recent data might be hidden     Patient questions:  Do you have a fever, pain , or abdominal swelling? No. Pain Score  0 *  Have you tolerated food without any problems? No.  Have you been able to return to your normal activities? Yes.    Do you have any questions about your discharge instructions: Diet   No. Medications  No. Follow up visit  No.  Do you have questions or concerns about your Care? No.  Actions: * If pain score is 4 or above: No action needed, pain <4.

## 2016-12-05 ENCOUNTER — Telehealth: Payer: Self-pay | Admitting: *Deleted

## 2016-12-05 NOTE — Telephone Encounter (Signed)
LMOM to call back so we can schedule repeat colonoscopy

## 2016-12-06 NOTE — Telephone Encounter (Signed)
LMOM to call back to reschedule colonoscopy. 

## 2016-12-06 NOTE — Telephone Encounter (Signed)
Beth,  I am forwarding this you because I have tried to reach his twice to schedule his colonoscopy.  Thanks, [employer]

## 2016-12-10 NOTE — Telephone Encounter (Signed)
Spoke with the patient. He will let us know when he is ready to reschedule. Has an appointment with his PCP in October.

## 2016-12-10 NOTE — Telephone Encounter (Signed)
ok 

## 2016-12-31 ENCOUNTER — Ambulatory Visit (INDEPENDENT_AMBULATORY_CARE_PROVIDER_SITE_OTHER): Payer: BC Managed Care – PPO | Admitting: *Deleted

## 2016-12-31 DIAGNOSIS — J309 Allergic rhinitis, unspecified: Secondary | ICD-10-CM | POA: Diagnosis not present

## 2017-01-13 ENCOUNTER — Telehealth: Payer: Self-pay

## 2017-01-13 MED ORDER — ROSUVASTATIN CALCIUM 5 MG PO TABS: 5.0000 mg | ORAL_TABLET | Freq: Every day | ORAL | 3 refills | Status: DC

## 2017-01-13 NOTE — Telephone Encounter (Signed)
Received fax from [place] in regards to a refill on Rosvustatin for patient. Medication was refilled per Dr. Verlene Mayer request. Sent one year

## 2017-01-14 ENCOUNTER — Ambulatory Visit (INDEPENDENT_AMBULATORY_CARE_PROVIDER_SITE_OTHER): Payer: BC Managed Care – PPO | Admitting: Internal Medicine

## 2017-01-14 ENCOUNTER — Encounter: Payer: Self-pay | Admitting: Internal Medicine

## 2017-01-14 VITALS — BP 118/84 | HR 64 | Temp 98.0°F | Wt 206.0 lb

## 2017-01-14 DIAGNOSIS — R413 Other amnesia: Secondary | ICD-10-CM | POA: Diagnosis not present

## 2017-01-14 DIAGNOSIS — E7800 Pure hypercholesterolemia, unspecified: Secondary | ICD-10-CM

## 2017-01-14 DIAGNOSIS — Z23 Encounter for immunization: Secondary | ICD-10-CM | POA: Diagnosis not present

## 2017-01-14 DIAGNOSIS — Z77090 Contact with and (suspected) exposure to asbestos: Secondary | ICD-10-CM | POA: Diagnosis not present

## 2017-01-14 DIAGNOSIS — E538 Deficiency of other specified B group vitamins: Secondary | ICD-10-CM

## 2017-01-14 DIAGNOSIS — I10 Essential (primary) hypertension: Secondary | ICD-10-CM

## 2017-01-14 NOTE — Addendum Note (Signed)
Addended by: Drucilla Schmidt on: 01/14/2017 10:49 AM   Modules accepted: Orders
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
## 2017-01-14 NOTE — Patient Instructions (Signed)
Flu vaccine given. Continue same medications. Return in 6 months for physical exam.

## 2017-02-09 ENCOUNTER — Ambulatory Visit (HOSPITAL_COMMUNITY)
Admission: EM | Admit: 2017-02-09 | Discharge: 2017-02-09 | Disposition: A | Discharge status: Home / Self Care | Payer: BC Managed Care – PPO | Attending: Physician Assistant | Admitting: Physician Assistant

## 2017-02-09 ENCOUNTER — Emergency Department (HOSPITAL_COMMUNITY): Payer: BC Managed Care – PPO

## 2017-02-09 ENCOUNTER — Encounter (HOSPITAL_COMMUNITY): Payer: Self-pay | Admitting: Emergency Medicine

## 2017-02-09 ENCOUNTER — Emergency Department (HOSPITAL_COMMUNITY)
Admission: EM | Admit: 2017-02-09 | Discharge: 2017-02-09 | Disposition: A | Discharge status: Home / Self Care | Payer: BC Managed Care – PPO | Attending: Emergency Medicine | Admitting: Emergency Medicine

## 2017-02-09 DIAGNOSIS — R42 Dizziness and giddiness: Secondary | ICD-10-CM

## 2017-02-09 DIAGNOSIS — Z79899 Other long term (current) drug therapy: Secondary | ICD-10-CM | POA: Diagnosis not present

## 2017-02-09 DIAGNOSIS — J45909 Unspecified asthma, uncomplicated: Secondary | ICD-10-CM | POA: Insufficient documentation

## 2017-02-09 DIAGNOSIS — I10 Essential (primary) hypertension: Secondary | ICD-10-CM | POA: Insufficient documentation

## 2017-02-09 DIAGNOSIS — Z85828 Personal history of other malignant neoplasm of skin: Secondary | ICD-10-CM | POA: Insufficient documentation

## 2017-02-09 DIAGNOSIS — R9431 Abnormal electrocardiogram [ECG] [EKG]: Secondary | ICD-10-CM

## 2017-02-09 LAB — COMPREHENSIVE METABOLIC PANEL
ALBUMIN: 4.4 g/dL (ref 3.5–5.0)
ALT: 41 U/L (ref 17–63)
ANION GAP: 11 (ref 5–15)
AST: 30 U/L (ref 15–41)
Alkaline Phosphatase: 43 U/L (ref 38–126)
BUN: 15 mg/dL (ref 6–20)
CHLORIDE: 101 mmol/L (ref 101–111)
CO2: 27 mmol/L (ref 22–32)
CREATININE: 1.06 mg/dL (ref 0.61–1.24)
Calcium: 9.3 mg/dL (ref 8.9–10.3)
GFR calc Af Amer: >60 mL/min (ref >60)
GFR calc non Af Amer: >60 mL/min (ref >60)
GLUCOSE: 119 mg/dL — AB (ref 65–99)
POTASSIUM: 3.3 mmol/L — AB (ref 3.5–5.1)
SODIUM: 139 mmol/L (ref 135–145)
Total Bilirubin: 1.1 mg/dL (ref 0.3–1.2)
Total Protein: 7 g/dL (ref 6.5–8.1)

## 2017-02-09 LAB — CBC
HCT: 42.5 % (ref 39.0–52.0)
Hemoglobin: 15.2 g/dL (ref 13.0–17.0)
MCH: 35.2 pg — AB (ref 26.0–34.0)
MCHC: 35.8 g/dL (ref 30.0–36.0)
MCV: 98.4 fL (ref 78.0–100.0)
PLATELETS: 116 10*3/uL — AB (ref 150–400)
RBC: 4.32 MIL/uL (ref 4.22–5.81)
RDW: 13 % (ref 11.5–15.5)
WBC: 7.3 10*3/uL (ref 4.0–10.5)

## 2017-02-09 LAB — I-STAT CHEM 8, ED
BUN: 18 mg/dL (ref 6–20)
CHLORIDE: 100 mmol/L — AB (ref 101–111)
Calcium, Ion: 1.12 mmol/L — ABNORMAL LOW (ref 1.15–1.40)
Creatinine, Ser: 1.1 mg/dL (ref 0.61–1.24)
GLUCOSE: 121 mg/dL — AB (ref 65–99)
HCT: 46 % (ref 39.0–52.0)
Hemoglobin: 15.6 g/dL (ref 13.0–17.0)
POTASSIUM: 3.3 mmol/L — AB (ref 3.5–5.1)
Sodium: 140 mmol/L (ref 135–145)
TCO2: 27 mmol/L (ref 22–32)

## 2017-02-09 LAB — APTT: aPTT: 26 seconds (ref 24–36)

## 2017-02-09 LAB — PROTIME-INR
INR: 0.97
PROTHROMBIN TIME: 12.8 s (ref 11.4–15.2)

## 2017-02-09 LAB — DIFFERENTIAL
BASOS PCT: 0 %
Basophils Absolute: 0 10*3/uL (ref 0.0–0.1)
EOS ABS: 0.1 10*3/uL (ref 0.0–0.7)
EOS PCT: 2 %
LYMPHS PCT: 15 %
Lymphs Abs: 1.1 10*3/uL (ref 0.7–4.0)
Monocytes Absolute: 0.6 10*3/uL (ref 0.1–1.0)
Monocytes Relative: 8 %
NEUTROS PCT: 76 %
Neutro Abs: 5.5 10*3/uL (ref 1.7–7.7)

## 2017-02-09 LAB — POCT URINALYSIS DIP (DEVICE)
Bilirubin Urine: NEGATIVE
GLUCOSE, UA: NEGATIVE mg/dL
Hgb urine dipstick: NEGATIVE
KETONES UR: NEGATIVE mg/dL
Leukocytes, UA: NEGATIVE
Nitrite: NEGATIVE
PROTEIN: NEGATIVE mg/dL
Specific Gravity, Urine: 1.02 (ref 1.005–1.030)
Urobilinogen, UA: 1 mg/dL (ref 0.0–1.0)
pH: 6 (ref 5.0–8.0)

## 2017-02-09 LAB — I-STAT TROPONIN, ED: Troponin i, poc: 0 ng/mL (ref 0.00–0.08)

## 2017-02-09 MED ORDER — POTASSIUM CHLORIDE CRYS ER 20 MEQ PO TBCR: 40.0000 meq | EXTENDED_RELEASE_TABLET | Freq: Once | ORAL | Status: DC
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
## 2017-02-09 NOTE — ED Triage Notes (Signed)
Pt sent from urgent care for further eval of dizziness onset at 2:30 pm today and abnormal EKG. Pt denies any other symptoms. Speech clear, no facial droop, equal grips, no arm drift.

## 2017-02-09 NOTE — ED Notes (Signed)
Adv pt to sit and wait in the lobby.... Adv him to nofitfy front staff if sx change/worsen... Pt verb understanding.

## 2017-02-09 NOTE — Discharge Instructions (Signed)
You were seen at [facility] ED after an episode of dizziness. Your blood tests showed mildly low potassium, which is expected when you are on a diuretic. The CT scan of your head did not show a stroke. Your EKG showed a conduction delay in the right side of the heart. We recommend you follow up with a cardiologist to further evaluate this finding. You can also follow up with your PCP who can refer you to a cardiologist as well.

## 2017-02-09 NOTE — ED Notes (Signed)
Patient brought to room in wheelchair.  Being hooked up to monitor at this time.

## 2017-02-09 NOTE — ED Provider Notes (Signed)
02/09/2017 5:12 PM   DOB: 05/25/1955 / MRN: [id number]  SUBJECTIVE:  Melvin Dixon is a 61 y.o. male presenting for an episode of spinning that last about 6-7 minutes. This occurred at 2:30 pm while he was in the car.  Associates nausea and diaphoresis. Symptoms completely abated at and he feels normal now.  There was no confusion.  He was driving when this happened and states that he went under a bridge, and upon coming out from under the bridge the symptoms began.  Has a history of Migraine and denies HA, vision changes, palpitations, emesis.  History of migraine. Denies weakness in hand or feat. Normal diet this morning.  Denies any new stressors today.  No new meds. Takes HTN medication and is compliant, taking for about 1 year or so now.    Current Facility-Administered Medications:  .  0.9 %  sodium chloride infusion, 500 mL, Intravenous, Continuous, Nandigam, Kavitha V, MD  Current Outpatient Prescriptions:  .  cetirizine (ZYRTEC) 10 MG tablet, Take 10 mg by mouth daily., Disp: , Rfl:  .  Cyanocobalamin (B-12) 1000 MCG/ML KIT, Inject 1,000 mcg as directed every 30 (thirty) days., Disp: 1 kit, Rfl: 12 .  Fluticasone-Salmeterol (ADVAIR) 250-50 MCG/DOSE AEPB, Inhale 1 puff into the lungs every 12 (twelve) hours., Disp: 60 each, Rfl: 11 .  ibuprofen (ADVIL,MOTRIN) 200 MG tablet, Take 200 mg by mouth every 6 (six) hours as needed., Disp: , Rfl:  .  losartan-hydrochlorothiazide (HYZAAR) 100-25 MG tablet, Take 1 tablet by mouth daily., Disp: 90 tablet, Rfl: 0 .  naproxen (NAPROSYN) 375 MG tablet, Take 375 mg by mouth as needed., Disp: , Rfl:  .  rosuvastatin (CRESTOR) 5 MG tablet, Take 1 tablet (5 mg total) by mouth daily., Disp: 90 tablet, Rfl: 3 .  acetaminophen (TYLENOL) 500 MG tablet, Take 500 mg by mouth as needed., Disp: , Rfl:  .  albuterol (PROVENTIL HFA;VENTOLIN HFA) 108 (90 BASE) MCG/ACT inhaler, Inhale 2 puffs into the lungs every 6 (six) hours as needed for wheezing or shortness of breath.  (Patient not taking: Reported on 01/14/2017), Disp: 1 Inhaler, Rfl: 3 .  cyclobenzaprine (FLEXERIL) 5 MG tablet, One or two po qHS prn (Patient not taking: Reported on 01/14/2017), Disp: 60 tablet, Rfl: 5   He has No Known Allergies.   He  has a past medical history of Allergy; Anxiety; Arthritis; Asthma; Basal cell carcinoma; Cancer ([place]); Hyperlipidemia; Hypertension; Memory loss; and Vision abnormalities.    He  reports that he has never smoked. He has never used smokeless tobacco. He reports that he drinks about 0.5 oz of alcohol per week . He reports that he does not use drugs. He  has no sexual activity history on file. The patient  has a past surgical history that includes Knee arthroscopy w/ meniscal repair (Right).  His family history includes Colon cancer in his maternal grandmother; Dementia in his mother; Heart disease in his father; Mental illness in his mother.  Review of Systems  Constitutional: Negative for chills and fever.  HENT: Negative for congestion, ear discharge, ear pain, hearing loss, sore throat and tinnitus.   Respiratory: Negative for cough and shortness of breath.   Cardiovascular: Negative for chest pain, palpitations and leg swelling.  Gastrointestinal: Negative for abdominal pain and nausea.  Neurological: Negative for dizziness.    OBJECTIVE:  BP 123/81 (BP Location: Left Arm)   Pulse 65   Temp 98.1 F (36.7 C) (Oral)   Resp 20   SpO2 98%  The 10-year ASCVD risk score Mikey Bussing DC Brooke Bonito., et al., 2013) is: 10.1%   Values used to calculate the score:     Age: 51 years     Sex: Male     Is Non-Hispanic African American: No     Diabetic: No     Tobacco smoker: No     Systolic Blood Pressure: 465 mmHg     Is BP treated: Yes     HDL Cholesterol: 54 mg/dL     Total Cholesterol: 211 mg/dL   Physical Exam  Constitutional: He is oriented to person, place, and time. He appears well-developed. He is active and cooperative.  Non-toxic appearance.  HENT:   Right Ear: Hearing, tympanic membrane, external ear and ear canal normal.  Left Ear: Hearing, tympanic membrane, external ear and ear canal normal.  Nose: Nose normal. Right sinus exhibits no maxillary sinus tenderness and no frontal sinus tenderness. Left sinus exhibits no maxillary sinus tenderness and no frontal sinus tenderness.  Mouth/Throat: Uvula is midline, oropharynx is clear and moist and mucous membranes are normal. No oropharyngeal exudate, posterior oropharyngeal edema or tonsillar abscesses.  Eyes: Pupils are equal, round, and reactive to light. Conjunctivae and EOM are normal.  Cardiovascular: Normal rate.   Pulmonary/Chest: Effort normal. No stridor. No tachypnea. No respiratory distress. He has no wheezes. He has no rales.  Musculoskeletal: Normal range of motion. He exhibits no edema, tenderness or deformity.  Lymphadenopathy:       Head (right side): No submandibular and no tonsillar adenopathy present.       Head (left side): No submandibular and no tonsillar adenopathy present.    He has no cervical adenopathy.  Neurological: He is alert and oriented to person, place, and time. He has normal strength and normal reflexes. He is not disoriented. He displays no atrophy, no tremor and normal reflexes. No cranial nerve deficit or sensory deficit. He exhibits normal muscle tone. He displays a negative Romberg sign. He displays no seizure activity. Coordination and gait normal. He displays no Babinski's sign on the right side. He displays no Babinski's sign on the left side.  FTN, RAM, Heel/Toe Walking, single leg balance all within normal limits. Negative dix/hallpike.  Skin: Skin is warm and dry. He is not diaphoretic. No pallor.  Psychiatric: His behavior is normal.  Vitals reviewed.   Results for orders placed or performed during the hospital encounter of 02/09/17 (from the past 72 hour(s))  POCT urinalysis dip (device)     Status: None   Collection Time: 02/09/17  4:40 PM   Result Value Ref Range   Glucose, UA NEGATIVE NEGATIVE mg/dL   Bilirubin Urine NEGATIVE NEGATIVE   Ketones, ur NEGATIVE NEGATIVE mg/dL   Specific Gravity, Urine 1.020 1.005 - 1.030   Hgb urine dipstick NEGATIVE NEGATIVE   pH 6.0 5.0 - 8.0   Protein, ur NEGATIVE NEGATIVE mg/dL   Urobilinogen, UA 1.0 0.0 - 1.0 mg/dL   Nitrite NEGATIVE NEGATIVE   Leukocytes, UA NEGATIVE NEGATIVE    Comment: Biochemical Testing Only. Please order routine urinalysis from main lab if confirmatory testing is needed.    No results found.  ASSESSMENT AND PLAN:  The primary encounter diagnosis was Dizziness. A diagnosis of Nonspecific abnormal electrocardiogram (ECG) (EKG) was also pertinent to this visit.  61 year old male with cardiac risk factors here with unexplained incapacitating dizziness and a normal neurological exam. He has what appears to be an early conduction delay on his EKG and I have no  comparison.  I have urged him to go to the ED for further work up to exclude an acute cardiac cause of his dizziness.  He agrees and will go.     The patient is advised to call or return to clinic if he does not see an improvement in symptoms, or to seek the care of the closest emergency department if he worsens with the above plan.   Philis Fendt, MHS, PA-C 02/09/2017 5:12 PM    Tereasa Coop, PA-C 02/09/17 1714

## 2017-02-09 NOTE — ED Notes (Signed)
Patient states that dizziness lasted approximately 45 minutes.  No dizziness at this time.  Feels worn out but eyesight back to normal.

## 2017-02-09 NOTE — ED Notes (Signed)
Patient Alert and oriented X4. Stable and ambulatory. Patient verbalized understanding of the discharge instructions.  Patient belongings were taken by the patient.  

## 2017-02-10 ENCOUNTER — Ambulatory Visit (INDEPENDENT_AMBULATORY_CARE_PROVIDER_SITE_OTHER): Payer: BC Managed Care – PPO | Admitting: Internal Medicine

## 2017-02-10 ENCOUNTER — Encounter: Payer: Self-pay | Admitting: Internal Medicine

## 2017-02-10 VITALS — BP 102/74 | HR 71 | Temp 97.7°F | Wt 204.0 lb

## 2017-02-10 DIAGNOSIS — E876 Hypokalemia: Secondary | ICD-10-CM

## 2017-02-10 DIAGNOSIS — R42 Dizziness and giddiness: Secondary | ICD-10-CM

## 2017-02-10 DIAGNOSIS — I10 Essential (primary) hypertension: Secondary | ICD-10-CM

## 2017-02-10 MED ORDER — POTASSIUM CHLORIDE ER 10 MEQ PO TBCR: 10.0000 meq | EXTENDED_RELEASE_TABLET | Freq: Every day | ORAL | 1 refills | Status: DC

## 2017-02-10 MED ORDER — ROSUVASTATIN CALCIUM 5 MG PO TABS: 5.0000 mg | ORAL_TABLET | Freq: Every day | ORAL | 3 refills | Status: DC

## 2017-02-10 NOTE — Patient Instructions (Addendum)
Take K-Dur 10 milliequivalents daily for low potassium.   Call if vertigo recurs.  Have potassium checked here in a couple of weeks.

## 2017-02-10 NOTE — Progress Notes (Signed)
   Subjective:    Patient ID: Melvin Dixon, male    DOB: October 11, 1955, 61 y.o.   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  While on [place] he experienced an acute episode of dizziness.  It was so severe he had to pull over.  He felt nauseated but did not vomit.  He had diaphoresis.  No chest pain.  History of hypertension well controlled.  History of B12 deficiency.  History of memory issues.  He takes low-dose Crestor for hyperlipidemia.  He takes Flexeril at night.  It helps him sleep.  He said everything was spinning around.  He was able to call for assistance and was given directions to the nearest urgent care facility where he was seen.  He had an EKG.  Unfortunately had no old EKG to compare in Epic.  There was concern for possible intraventricular conduction delay.  He was sent to the emergency department.  He had a CT of his brain which showed no evidence of bleed or stroke.  However EKG done in 2009 shows similar findings.  This was in his old paper chart here at the office.  He is relieved to find out there is very little change in his EKG after all.  His potassium was slightly low at 3.3.  Nonfasting glucose was 121.  He also had worked at his office on Saturday.  Says he was not eating well most couple of days because his wife was out of town.  The previous weekend he was out of town with his wife for a wedding in [place].  Continues to work full-time at [employer].  Today he went to work and completed a full day without any episodes of dizziness.    Review of Systems see above     Objective:   Physical Exam Skin warm and dry.  Nodes none.  Neck is supple.  No JVD thyromegaly or carotid bruits.  Chest clear.  Cardiac exam regular rate and rhythm.  He has no evidence of nystagmus on extraocular movement testing.  He  is alert and appropriate with no gross focal deficits on brief neurological exam.       Assessment & Plan:  My feeling is that he had an acute episode of vertigo but I am not sure exactly what triggered it.  I do not think he had a cardiac dysrhythmia but cannot be completely certain.  However he is feeling better today and I have not given him any prescriptions.  We will continue to monitor the situation and he will call if he has further issues.  I recommended K-Dur 10 mEq daily.

## 2017-02-11 ENCOUNTER — Encounter: Payer: Self-pay | Admitting: Pulmonary Disease

## 2017-02-11 ENCOUNTER — Ambulatory Visit (INDEPENDENT_AMBULATORY_CARE_PROVIDER_SITE_OTHER): Payer: BC Managed Care – PPO | Admitting: Pulmonary Disease

## 2017-02-11 ENCOUNTER — Ambulatory Visit (INDEPENDENT_AMBULATORY_CARE_PROVIDER_SITE_OTHER)
Admission: RE | Admit: 2017-02-11 | Discharge: 2017-02-11 | Disposition: A | Discharge status: Home / Self Care | Payer: BC Managed Care – PPO | Source: Ambulatory Visit | Attending: Pulmonary Disease | Admitting: Pulmonary Disease

## 2017-02-11 ENCOUNTER — Telehealth: Payer: Self-pay | Admitting: Pulmonary Disease

## 2017-02-11 VITALS — BP 120/64 | HR 65 | Ht 73.5 in | Wt 207.4 lb

## 2017-02-11 DIAGNOSIS — R0600 Dyspnea, unspecified: Secondary | ICD-10-CM

## 2017-02-11 LAB — PULMONARY FUNCTION TEST
DL/VA % pred: 123 %
DL/VA: 5.97 ml/min/mmHg/L
DLCO COR % PRED: 98 %
DLCO COR: 37.05 ml/min/mmHg
DLCO UNC % PRED: 100 %
DLCO UNC: 38.05 ml/min/mmHg
FEF 25-75 POST: 3.25 L/s
FEF 25-75 PRE: 2.3 L/s
FEF2575-%Change-Post: 41 %
FEF2575-%PRED-PRE: 70 %
FEF2575-%Pred-Post: 99 %
FEV1-%Change-Post: 9 %
FEV1-%Pred-Post: 84 %
FEV1-%Pred-Pre: 77 %
FEV1-POST: 3.44 L
FEV1-Pre: 3.15 L
FEV1FVC-%Change-Post: 3 %
FEV1FVC-%PRED-PRE: 98 %
FEV6-%CHANGE-POST: 5 %
FEV6-%Pred-Post: 86 %
FEV6-%Pred-Pre: 81 %
FEV6-POST: 4.46 L
FEV6-Pre: 4.21 L
FEV6FVC-%CHANGE-POST: 0 %
FEV6FVC-%PRED-POST: 103 %
FEV6FVC-%Pred-Pre: 103 %
FVC-%Change-Post: 5 %
FVC-%PRED-PRE: 78 %
FVC-%Pred-Post: 82 %
FVC-POST: 4.47 L
FVC-PRE: 4.25 L
PRE FEV1/FVC RATIO: 74 %
Post FEV1/FVC ratio: 77 %
Post FEV6/FVC ratio: 100 %
Pre FEV6/FVC Ratio: 99 %
RV % pred: 114 %
RV: 2.86 L
TLC % PRED: 90 %
TLC: 7.06 L

## 2017-02-11 NOTE — Telephone Encounter (Signed)
OK will make sure he knows.

## 2017-02-11 NOTE — Progress Notes (Signed)
Thanks- needed some guidance

## 2017-02-11 NOTE — Telephone Encounter (Signed)
I tried calling Melvin Dixon to let him know that his pulmonary function test showed subtle small airways obstruction which improved with a bronchodilator.  He tells me that he has been using Advair but only 1 puff daily.  I advised him to use 1 puff twice a day for the next 2 weeks and then will reassess when he comes back on November 13.

## 2017-02-11 NOTE — Patient Instructions (Signed)
Shortness of breath in the setting of asbestos exposure: We will monitor your oxygen level while walking around today Pulmonary function test Chest x-ray   If these are normal then we will arrange for a CT scan  Asthma/allergy: Continue immunotherapy  We will see you back in 1-2 weeks or sooner if needed

## 2017-02-11 NOTE — Progress Notes (Signed)
Subjective:    Patient ID: Melvin Dixon, male    DOB: January 14, 1956, 61 y.o.   MRN: [id number]  Synopsis: referred in 2018 for evaluation of dyspnea in the setting of asbestos exposure  HPI Chief Complaint  Patient presents with  . Advice Only    Referred by Dr.  Renold Genta for aspestos exposure, worsening breathing problems.    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  He notes some dypsnea and upper back pain for th elast several months mor in the right shoulder that bothers him.  Shortness of breath: > most of the summer > he thought this was a process of aging > he says that his wife has noticed he has to sit out > gardening, yard work, housework makes him short of breath > climbing a flight of stairs is hard  > his weight is stable > when he is short of breath he feels a vague pain in his chest.  It's not intense, it is dull, it doesn't radiate.  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  Asthma: > he says that early in his career he was exposed to rodents in his research, he developed an allergy  > he has bene told he is allergic to cats, dogs, pollen > he had some allergy testing done a few years > he is on immunotherapy, remains compliant > he doesn't think that his other allergy symptoms are worse,  > dyspnea: no association with environmental changes (cat exposure)  He has never smoked.     Past Medical History:  Diagnosis Date  . Allergy   . Anxiety    2 years ago no longer on meds  Zoloft  . Arthritis    osteoarthritis  . Asthma   . Basal cell carcinoma   . Cancer ([place])   . Hyperlipidemia   . Hypertension   . Memory loss   . Vision abnormalities    migraines     Family History  Problem Relation Age of Onset  . Mental illness Mother   . Dementia Mother   . Heart disease Father   . Asthma Sister   . Lung cancer Sister   . Colon cancer Maternal Grandmother   . Colon polyps Neg Hx   . Esophageal cancer Neg Hx   . Rectal cancer Neg Hx   . Stomach cancer Neg Hx      Social History   Social History  . Marital status: Married    Spouse name: N/A  . Number of children: N/A  . Years of education: N/A   Occupational History  . Not on file.   Social History Main Topics  . Smoking status: Passive Smoke Exposure - Never Smoker  . Smokeless tobacco: Never Used     Comment: father smoked cigars in home  . Alcohol use 0.5 oz/week    1 Standard drinks or equivalent per week     Comment: daily wine  . Drug use: No  . Sexual activity: Not on file   Other Topics Concern  . Not on file   Social History Narrative  . No narrative on file  No Known Allergies   Outpatient Medications Prior to Visit  Medication Sig Dispense Refill  . acetaminophen (TYLENOL) 500 MG tablet Take 500 mg by mouth as needed.    Marland Kitchen albuterol (PROVENTIL HFA;VENTOLIN HFA) 108 (90 BASE) MCG/ACT inhaler Inhale 2 puffs into the lungs every 6 (six) hours as needed for wheezing or shortness of breath. 1 Inhaler 3  . cetirizine (ZYRTEC) 10 MG tablet Take 10 mg by mouth daily.    . Cyanocobalamin (B-12) 1000 MCG/ML KIT Inject 1,000 mcg as directed every 30 (thirty) days. 1 kit 12  . cyclobenzaprine (FLEXERIL) 5 MG tablet One or two po qHS prn 60 tablet 5  . Fluticasone-Salmeterol (ADVAIR) 250-50 MCG/DOSE AEPB Inhale 1 puff into the lungs every 12 (twelve) hours. 60 each 11  . ibuprofen (ADVIL,MOTRIN) 200 MG tablet Take 200 mg by mouth every 6 (six) hours as needed.    Marland Kitchen losartan-hydrochlorothiazide (HYZAAR) 100-25 MG tablet Take 1 tablet by mouth daily. 90 tablet 0  . naproxen (NAPROSYN) 375 MG tablet Take 375 mg by mouth as needed.    . potassium chloride  (K-DUR) 10 MEQ tablet Take 1 tablet (10 mEq total) by mouth daily. 30 tablet 1  . rosuvastatin (CRESTOR) 5 MG tablet Take 1 tablet (5 mg total) by mouth daily. 90 tablet 3   Facility-Administered Medications Prior to Visit  Medication Dose Route Frequency Provider Last Rate Last Dose  . 0.9 %  sodium chloride infusion  500 mL Intravenous Continuous Nandigam, Kavitha V, MD          Review of Systems  Constitutional: Negative for fever and unexpected weight change.  HENT: Negative for congestion, dental problem, ear pain, nosebleeds, postnasal drip, rhinorrhea, sinus pressure, sneezing, sore throat and trouble swallowing.   Eyes: Negative for redness and itching.  Respiratory: Positive for cough, chest tightness and shortness of breath. Negative for wheezing.   Cardiovascular: Negative for palpitations and leg swelling.  Gastrointestinal: Negative for nausea and vomiting.  Genitourinary: Negative for dysuria.  Musculoskeletal: Negative for joint swelling.  Skin: Negative for rash.  Neurological: Negative for headaches.  Hematological: Does not bruise/bleed easily.  Psychiatric/Behavioral: Negative for dysphoric mood. The patient is not nervous/anxious.        Objective:   Physical Exam Vitals:   02/11/17 0900  BP: 120/64  Pulse: 65  SpO2: 97%  Weight: 207 lb 6.4 oz (94.1 kg)  Height: 6' 1.5" (1.867 m)   Gen: well appearing, no acute distress HENT: NCAT, OP clear, neck supple without masses Eyes: PERRL, EOMi Lymph: no cervical lymphadenopathy PULM: Few crackles throughout that clear with effort, normal percussion B CV: RRR, no mgr, no JVD GI: BS+, soft, nontender, no hsm Derm: no rash or skin breakdown MSK: normal bulk and tone Neuro: A&Ox4, CN II-XII intact, strength 5/5 in all 4 extremities Psyche: normal mood and affect    Chest imaging: 2008 chest x-ray images independently reviewed showing what appears to be normal pulmonary parenchyma, perhaps slight  hyperinflation  Records from his visit with Dr. Renold Genta yesterday reviewed, he was recently seen in the emergency room for dizziness.  His EKG showed no change compared to that previously documented by Dr. Renold Genta.  CBC    Component Value Date/Time   WBC 7.3 02/09/2017 1747   RBC 4.32 02/09/2017 1747   HGB 15.6 02/09/2017 1807   HCT 46.0 02/09/2017 1807   PLT 116 (L) 02/09/2017 1747   MCV 98.4 02/09/2017 1747   MCH 35.2 (H) 02/09/2017 1747  MCHC 35.8 02/09/2017 1747   RDW 13.0 02/09/2017 1747   LYMPHSABS 1.1 02/09/2017 1747   MONOABS 0.6 02/09/2017 1747   EOSABS 0.1 02/09/2017 1747   BASOSABS 0.0 02/09/2017 1747       Assessment & Plan:   Dyspnea, unspecified type - Plan: Pulmonary function test, DG Chest 2 View  Discussion: [place]den presents today for evaluation of shortness of breath.  He has a history of asthma but it sounds as if this is been fairly well controlled with immunotherapy.  He is quite concerned about his asbestos exposure.  He is noted to coworkers who have developed lung cancer recently.  Objectively there is minimal abnormality on his pulmonary exam today, a recent CBC did not show anemia and his physical exam does not show signs of neuromuscular weakness.  It is reasonable to assess his pulmonary status with a lung function test chest x-ray and ambulatory oximetry monitoring.  If there are any abnormalities we can arrange for a CT scan of the chest.  However, if all these tests are normal then it would be reasonable for him to have a cardiac evaluation considering the chest tightness he has experienced from time to time.  Plan: Shortness of breath in the setting of asbestos exposure: We will monitor your oxygen level while walking around today Pulmonary function test Chest x-ray   If these are normal then we will arrange for a CT scan  Asthma/allergy: Continue immunotherapy  We will see you back in 1-2 weeks or sooner if needed    Current Outpatient  Prescriptions:  .  acetaminophen (TYLENOL) 500 MG tablet, Take 500 mg by mouth as needed., Disp: , Rfl:  .  albuterol (PROVENTIL HFA;VENTOLIN HFA) 108 (90 BASE) MCG/ACT inhaler, Inhale 2 puffs into the lungs every 6 (six) hours as needed for wheezing or shortness of breath., Disp: 1 Inhaler, Rfl: 3 .  cetirizine (ZYRTEC) 10 MG tablet, Take 10 mg by mouth daily., Disp: , Rfl:  .  Cyanocobalamin (B-12) 1000 MCG/ML KIT, Inject 1,000 mcg as directed every 30 (thirty) days., Disp: 1 kit, Rfl: 12 .  cyclobenzaprine (FLEXERIL) 5 MG tablet, One or two po qHS prn, Disp: 60 tablet, Rfl: 5 .  Fluticasone-Salmeterol (ADVAIR) 250-50 MCG/DOSE AEPB, Inhale 1 puff into the lungs every 12 (twelve) hours., Disp: 60 each, Rfl: 11 .  ibuprofen (ADVIL,MOTRIN) 200 MG tablet, Take 200 mg by mouth every 6 (six) hours as needed., Disp: , Rfl:  .  losartan-hydrochlorothiazide (HYZAAR) 100-25 MG tablet, Take 1 tablet by mouth daily., Disp: 90 tablet, Rfl: 0 .  naproxen (NAPROSYN) 375 MG tablet, Take 375 mg by mouth as needed., Disp: , Rfl:  .  potassium chloride (K-DUR) 10 MEQ tablet, Take 1 tablet (10 mEq total) by mouth daily., Disp: 30 tablet, Rfl: 1 .  rosuvastatin (CRESTOR) 5 MG tablet, Take 1 tablet (5 mg total) by mouth daily., Disp: 90 tablet, Rfl: 3  Current Facility-Administered Medications:  .  0.9 %  sodium chloride infusion, 500 mL, Intravenous, Continuous, Nandigam, Venia Minks, MD

## 2017-02-11 NOTE — Patient Instructions (Signed)
PFT done today. 

## 2017-02-21 ENCOUNTER — Telehealth: Payer: Self-pay | Admitting: Pulmonary Disease

## 2017-02-21 NOTE — Telephone Encounter (Signed)
wants to know if he has to come to his appt on 11/13 with BQ. The last ov notes explains a CT scan will be ordered if the chest ray and PFT was normal. BQ please advise next step.     Patient Instructions by Juanito Doom, MD at 02/11/2017 9:00 AM   Author: Juanito Doom, MD Author Type: Physician Filed: 02/11/2017 9:34 AM  Note Status: Signed Cosign: Cosign Not Required Encounter Date: 02/11/2017  Editor: Juanito Doom, MD (Physician)    Shortness of breath in the setting of asbestos exposure: We will monitor your oxygen level while walking around today Pulmonary function test Chest x-ray   If these are normal then we will arrange for a CT scan  Asthma/allergy: Continue immunotherapy  We will see you back in 1-2 weeks or sooner if needed

## 2017-02-25 ENCOUNTER — Encounter: Payer: Self-pay | Admitting: Adult Health

## 2017-02-25 ENCOUNTER — Other Ambulatory Visit: Payer: BC Managed Care – PPO | Admitting: Internal Medicine

## 2017-02-25 ENCOUNTER — Ambulatory Visit: Payer: BC Managed Care – PPO | Admitting: Adult Health

## 2017-02-25 DIAGNOSIS — I10 Essential (primary) hypertension: Secondary | ICD-10-CM

## 2017-02-25 DIAGNOSIS — R0600 Dyspnea, unspecified: Secondary | ICD-10-CM | POA: Diagnosis not present

## 2017-02-25 DIAGNOSIS — J3089 Other allergic rhinitis: Secondary | ICD-10-CM

## 2017-02-25 DIAGNOSIS — J4530 Mild persistent asthma, uncomplicated: Secondary | ICD-10-CM

## 2017-02-25 LAB — BASIC METABOLIC PANEL WITH GFR
BUN: 13 mg/dL (ref 7–25)
CO2: 32 mmol/L (ref 20–32)
CREATININE: 0.91 mg/dL (ref 0.70–1.25)
Calcium: 9.1 mg/dL (ref 8.6–10.3)
Chloride: 101 mmol/L (ref 98–110)
GFR, EST NON AFRICAN AMERICAN: 91 mL/min/{1.73_m2} (ref >=60)
GFR, Est African American: 105 mL/min/{1.73_m2} (ref >=60)
GLUCOSE: 96 mg/dL (ref 65–99)
POTASSIUM: 4.1 mmol/L (ref 3.5–5.3)
SODIUM: 139 mmol/L (ref 135–146)

## 2017-02-25 MED ORDER — BUDESONIDE-FORMOTEROL FUMARATE 160-4.5 MCG/ACT IN AERO: 2.0000 | INHALATION_SPRAY | Freq: Two times a day (BID) | RESPIRATORY_TRACT | 0 refills | Status: DC

## 2017-02-25 MED ORDER — BUDESONIDE-FORMOTEROL FUMARATE 160-4.5 MCG/ACT IN AERO: 2.0000 | INHALATION_SPRAY | Freq: Two times a day (BID) | RESPIRATORY_TRACT | 5 refills | Status: DC

## 2017-02-25 NOTE — Progress Notes (Signed)
$'@Patient'c$  ID: Melvin Dixon, male    DOB: 04-08-1956, 61 y.o.   MRN: [id number]  Chief Complaint  Patient presents with  . Follow-up    Dyspnea     Referring provider: Elby Showers, MD  HPI: 61 year old male never smoker seen for pulmonary consult February 11, 2017 for dyspnea in the setting of previous asbestos exposure  02/25/2017 Follow up : Dyspnea  Patient returns for a 2-week follow-up.  Patient was seen last visit for pulmonary consult.  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 Had 2 non smoking colleagues dx with Lung Cancer ,  He had noticed some dyspnea over the last several months.   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 He does have asthma and is on Advair and Imnunotherapy for years. .  Patient was instructed to increase Advair to twice daily.   Pulmonary function test done October 30 showed normal lung function with subtle small airways obstruction.  FEV1 84%, ratio 77, FVC 82%.  No significant bronchodilator response.  Small airway showed significant reversibility.  DLCO 100%. Chest x-ray showed clear lungs.  Since going up on Advair Twice daily  Feels breathing is better. Not back to baseline but improved. No increased cough or wheezing.  Denies GERD or drainage.  Does complain of chronic throat clearing . Mild intermittent hoarseness. Takes Zyrtec daily.  No weight loss, hemoptysis , chest pain , orthopnea, edema.     No Known Allergies  Immunization History  Administered Date(s) Administered  . Influenza,inj,Quad PF,6+ Mos 12/16/2014, 01/14/2017  . Tdap 11/14/2009  . Zoster Recombinat (Shingrix) 11/30/2016    Past Medical History:  Diagnosis Date  . Allergy   . Anxiety    2 years ago no longer on meds  Zoloft  . Arthritis    osteoarthritis  . Asthma   . Basal cell carcinoma   . Cancer ([place])   . Hyperlipidemia   . Hypertension   . Memory loss   . Vision abnormalities    migraines    Tobacco History: Social History    Tobacco Use  Smoking Status Passive Smoke Exposure - Never Smoker  Smokeless Tobacco Never Used  Tobacco Comment   father smoked cigars in home   Counseling given: Not Answered Comment: father smoked cigars in home   Outpatient Encounter Medications as of 02/25/2017  Medication Sig  . acetaminophen (TYLENOL) 500 MG tablet Take 500 mg by mouth as needed.  Marland Kitchen albuterol (PROVENTIL HFA;VENTOLIN HFA) 108 (90 BASE) MCG/ACT inhaler Inhale 2 puffs into the lungs every 6 (six) hours as needed for wheezing or shortness of breath.  . cetirizine (ZYRTEC) 10 MG tablet Take 10 mg by mouth daily.  . Cyanocobalamin (B-12) 1000 MCG/ML KIT Inject 1,000 mcg as directed every 30 (thirty) days.  . cyclobenzaprine (FLEXERIL) 5 MG tablet One or two po qHS prn  . Fluticasone-Salmeterol (ADVAIR) 250-50 MCG/DOSE AEPB Inhale 1 puff into the lungs every 12 (twelve) hours.  Marland Kitchen ibuprofen (ADVIL,MOTRIN) 200 MG tablet Take 200 mg by mouth every 6 (six) hours as needed.  Marland Kitchen losartan-hydrochlorothiazide (HYZAAR) 100-25 MG tablet Take 1 tablet by mouth daily.  . naproxen (NAPROSYN) 375 MG tablet Take 375 mg by mouth as needed.  . potassium chloride (K-DUR) 10 MEQ tablet Take 1 tablet (10 mEq total) by mouth daily.  . rosuvastatin (CRESTOR) 5 MG tablet Take 1 tablet (5 mg total) by mouth daily.   Facility-Administered Encounter Medications as of 02/25/2017  Medication  .  0.9 %  sodium chloride infusion     Review of Systems  Constitutional:   No  weight loss, night sweats,  Fevers, chills, fatigue, or  lassitude.  HEENT:   No headaches,  Difficulty swallowing,  Tooth/dental problems, or  Sore throat,                No sneezing, itching, ear ache, nasal congestion, post nasal drip,   CV:  No chest pain,  Orthopnea, PND, swelling in lower extremities, anasarca, dizziness, palpitations, syncope.   GI  No heartburn, indigestion, abdominal pain, nausea, vomiting, diarrhea, change in bowel habits, loss of appetite,  bloody stools.   Resp:  .  No excess mucus, no productive cough,  No non-productive cough,  No coughing up of blood.  No change in color of mucus.  No wheezing.  No chest wall deformity  Skin: no rash or lesions.  GU: no dysuria, change in color of urine, no urgency or frequency.  No flank pain, no hematuria   MS:  No joint pain or swelling.  No decreased range of motion.  No back pain.    Physical Exam  BP 116/80 (BP Location: Left Arm, Cuff Size: Normal)   Pulse (!) 57   Ht 5' 11.5" (1.816 m)   Wt 209 lb 9.6 oz (95.1 kg)   SpO2 96%   BMI 28.83 kg/m   GEN: A/Ox3; pleasant , NAD, well nourished    HEENT:  [place]/AT,  EACs-clear, TMs-wnl, NOSE-clear, THROAT-clear, no lesions, no postnasal drip or exudate noted.   NECK:  Supple w/ fair ROM; no JVD; normal carotid impulses w/o bruits; no thyromegaly or nodules palpated; no lymphadenopathy.    RESP  Clear  P & A; w/o, wheezes/ rales/ or rhonchi. no accessory muscle use, no dullness to percussion  CARD:  RRR, no m/r/g, no peripheral edema, pulses intact, no cyanosis or clubbing.  GI:   Soft & nt; nml bowel sounds; no organomegaly or masses detected.   Musco: Warm bil, no deformities or joint swelling noted.   Neuro: alert, no focal deficits noted.    Skin: Warm, no lesions or rashes    Lab Results:  CBC   BNP No results found for: BNP  ProBNP No results found for: PROBNP  Imaging: Dg Chest 2 View  Result Date: 02/11/2017 CLINICAL DATA:  Dyspnea EXAM: CHEST  2 VIEW COMPARISON:  05/19/2006 FINDINGS: Cardiac shadow is within normal limits. Mild tortuosity of the aorta is again identified. The lungs are well aerated bilaterally. No focal infiltrate or sizable effusion is seen. Degenerative changes of the thoracic spine are noted. IMPRESSION: No active cardiopulmonary disease. Electronically Signed   By: Inez Catalina M.D.   On: 02/11/2017 10:55   Ct Head Wo Contrast  Result Date: 02/09/2017 CLINICAL DATA:  Dizziness  with abnormal EKG EXAM: CT HEAD WITHOUT CONTRAST TECHNIQUE: Contiguous axial images were obtained from the base of the skull through the vertex without intravenous contrast. COMPARISON:  MRI 10/19/2014 FINDINGS: Brain: No evidence of acute infarction, hemorrhage, hydrocephalus, extra-axial collection or mass lesion/mass effect. Vascular: No hyperdense vessel or unexpected calcification. Skull: Normal. Negative for fracture or focal lesion. Sinuses/Orbits: Mild mucosal thickening within the ethmoid sinuses. No acute orbital abnormality. Other: None IMPRESSION: No CT evidence for acute intracranial abnormality. Electronically Signed   By: Donavan Foil M.D.   On: 02/09/2017 18:30     Assessment & Plan:   Asthma Improved symptom control on Twice daily  ICS/LABA dosing . Will  change from DPI to see if this helps the upper airway irritation   Plan  Patient Instructions  Stop Advair  Begin Symbicort 160 2 puffs Twice daily  , brush/rinse /gargle after use. May drink some water afterwards.  Continue on Zyrtec daily .  May use Saline nasal rinses As needed   Follow up with Dr. Lake Bells in 2 months and As needed      Allergic rhinitis follow up with Allergist  Cont on Zyrtec  Saline nasal As needed    Dyspnea Dyspnea improved with increased Advair dose suspect is Asthma related. PFT and CXR are unrevealing except for small airway reversibility c/w asthma . Would continue with Asthma tx . If not back to baseline , consider HRCT chest to evaluate. Rexene Edison, NP 02/25/2017

## 2017-02-25 NOTE — Assessment & Plan Note (Signed)
follow up with Allergist  Cont on Zyrtec  Saline nasal As needed

## 2017-02-25 NOTE — Assessment & Plan Note (Signed)
Improved symptom control on Twice daily  ICS/LABA dosing . Will change from DPI to see if this helps the upper airway irritation   Plan  Patient Instructions  Stop Advair  Begin Symbicort 160 2 puffs Twice daily  , brush/rinse /gargle after use. May drink some water afterwards.  Continue on Zyrtec daily .  May use Saline nasal rinses As needed   Follow up with Dr. Lake Bells in 2 months and As needed

## 2017-02-25 NOTE — Progress Notes (Signed)
Patient seen in the office today and instructed on use of Symbicort 160/4.35mcg.  Patient expressed understanding and demonstrated technique. Parke Poisson, CMA 02/25/17

## 2017-02-25 NOTE — Addendum Note (Signed)
Addended by: Parke Poisson E on: 02/25/2017 04:13 PM   Modules accepted: Orders

## 2017-02-25 NOTE — Patient Instructions (Signed)
Stop Advair  Begin Symbicort 160 2 puffs Twice daily  , brush/rinse /gargle after use. May drink some water afterwards.  Continue on Zyrtec daily .  May use Saline nasal rinses As needed   Follow up with Dr. Lake Bells in 2 months and As needed

## 2017-02-25 NOTE — Assessment & Plan Note (Signed)
Dyspnea improved with increased Advair dose suspect is Asthma related. PFT and CXR are unrevealing except for small airway reversibility c/w asthma . Would continue with Asthma tx . If not back to baseline , consider HRCT chest to evaluate. Marland Kitchen

## 2017-02-26 ENCOUNTER — Telehealth: Payer: Self-pay

## 2017-02-26 NOTE — Telephone Encounter (Signed)
-----   Message from Elby Showers, MD sent at 02/26/2017 10:46 AM EST ----- Potassium is now normal. Continue potassium supplement. Kidney functions are normal.

## 2017-02-26 NOTE — Telephone Encounter (Signed)
LVM on pts cell phone going over results which per DPR in chart is allowed by patient

## 2017-02-27 NOTE — Telephone Encounter (Signed)
He was seen by Lynelle Smoke, will complete phone note

## 2017-02-27 NOTE — Progress Notes (Signed)
Reviewed, agree 

## 2017-02-28 ENCOUNTER — Ambulatory Visit (INDEPENDENT_AMBULATORY_CARE_PROVIDER_SITE_OTHER): Payer: BC Managed Care – PPO

## 2017-02-28 DIAGNOSIS — J309 Allergic rhinitis, unspecified: Secondary | ICD-10-CM | POA: Diagnosis not present

## 2017-03-13 ENCOUNTER — Ambulatory Visit (INDEPENDENT_AMBULATORY_CARE_PROVIDER_SITE_OTHER): Payer: BC Managed Care – PPO | Admitting: *Deleted

## 2017-03-13 DIAGNOSIS — J309 Allergic rhinitis, unspecified: Secondary | ICD-10-CM | POA: Diagnosis not present

## 2017-03-20 ENCOUNTER — Ambulatory Visit (INDEPENDENT_AMBULATORY_CARE_PROVIDER_SITE_OTHER): Payer: BC Managed Care – PPO

## 2017-03-20 DIAGNOSIS — J309 Allergic rhinitis, unspecified: Secondary | ICD-10-CM | POA: Diagnosis not present

## 2017-03-28 ENCOUNTER — Ambulatory Visit (INDEPENDENT_AMBULATORY_CARE_PROVIDER_SITE_OTHER): Payer: BC Managed Care – PPO

## 2017-03-28 DIAGNOSIS — J309 Allergic rhinitis, unspecified: Secondary | ICD-10-CM | POA: Diagnosis not present

## 2017-04-04 ENCOUNTER — Ambulatory Visit (INDEPENDENT_AMBULATORY_CARE_PROVIDER_SITE_OTHER): Payer: BC Managed Care – PPO

## 2017-04-04 DIAGNOSIS — J309 Allergic rhinitis, unspecified: Secondary | ICD-10-CM | POA: Diagnosis not present

## 2017-04-22 ENCOUNTER — Ambulatory Visit (INDEPENDENT_AMBULATORY_CARE_PROVIDER_SITE_OTHER): Payer: BC Managed Care – PPO | Admitting: *Deleted

## 2017-04-22 DIAGNOSIS — J309 Allergic rhinitis, unspecified: Secondary | ICD-10-CM

## 2017-04-29 ENCOUNTER — Ambulatory Visit: Payer: BC Managed Care – PPO | Admitting: Pulmonary Disease

## 2017-04-29 ENCOUNTER — Encounter: Payer: Self-pay | Admitting: Pulmonary Disease

## 2017-04-29 VITALS — BP 116/72 | HR 62 | Ht 71.5 in | Wt 214.0 lb

## 2017-04-29 DIAGNOSIS — J4530 Mild persistent asthma, uncomplicated: Secondary | ICD-10-CM | POA: Diagnosis not present

## 2017-04-29 DIAGNOSIS — R490 Dysphonia: Secondary | ICD-10-CM | POA: Diagnosis not present

## 2017-04-29 MED ORDER — BUDESONIDE-FORMOTEROL FUMARATE 80-4.5 MCG/ACT IN AERO: 2.0000 | INHALATION_SPRAY | Freq: Two times a day (BID) | RESPIRATORY_TRACT | 11 refills | Status: DC

## 2017-04-29 NOTE — Progress Notes (Signed)
Subjective:    Patient ID: Melvin Dixon, male    DOB: 03-25-1956, 62 y.o.   MRN: [id number]  Synopsis: referred in 2018 for evaluation of dyspnea in the setting of asbestos exposure  HPI Chief Complaint  Patient presents with  . Follow-up    pt doing well, notes throat clearing and worsening hoarseness throughout the day.     Asthma: > currently taking Symbicort twice a day and he feels this is working well > he feels like his breathing has improved > still has some hoarseness and difficulty clearing his throat  > minimal wheeze, shortness of breath  Hoarseness: > still a problem with frequent talking  Past Medical History:  Diagnosis Date  . Allergy   . Anxiety    2 years ago no longer on meds  Zoloft  . Arthritis    osteoarthritis  . Asthma   . Basal cell carcinoma   . Cancer ([place])   . Hyperlipidemia   . Hypertension   . Memory loss   . Vision abnormalities    migraines        Review of Systems  Constitutional: Negative for fever and unexpected weight change.  HENT: Negative for congestion, dental problem, ear pain, nosebleeds, postnasal drip, rhinorrhea, sinus pressure, sneezing, sore throat and trouble swallowing.   Eyes: Negative for redness and itching.  Respiratory: Positive for cough, chest tightness and shortness of breath. Negative for wheezing.   Cardiovascular: Negative for palpitations and leg swelling.  Gastrointestinal: Negative for nausea and vomiting.  Genitourinary: Negative for dysuria.  Musculoskeletal: Negative for joint swelling.  Skin: Negative for rash.  Neurological: Negative for headaches.  Hematological: Does not bruise/bleed easily.  Psychiatric/Behavioral: Negative for dysphoric mood. The patient is not nervous/anxious.        Objective:   Physical Exam Vitals:   04/29/17 0905  BP: 116/72  Pulse: 62  SpO2: 96%  Weight: 214 lb (97.1 kg)  Height: 5' 11.5" (1.816 m)    Gen: well appearing HENT: OP clear, TM's clear, neck  supple PULM: CTA B, normal percussion CV: RRR, no mgr, trace edema GI: BS+, soft, nontender Derm: no cyanosis or rash Psyche: normal mood and affect     Chest imaging: 2008 chest x-ray images independently reviewed showing what appears to be normal pulmonary parenchyma, perhaps slight hyperinflation 2018 CXR images reviewed showing normal pulmonary parenchyma, no calcified plaques  Pulmonary function testing: Ratio normal, FEV1 3.44 L 84% predicted, FVC 4.47 L 82% predicted, total lung capacity 7.06 L 90% predicted, DLCO 38.1 mL 100% predicted  CBC    Component Value Date/Time   WBC 7.3 02/09/2017 1747   RBC 4.32 02/09/2017 1747   HGB 15.6 02/09/2017 1807   HCT 46.0 02/09/2017 1807   PLT 116 (L) 02/09/2017 1747   MCV 98.4 02/09/2017 1747   MCH 35.2 (H) 02/09/2017 1747   MCHC 35.8 02/09/2017 1747   RDW 13.0 02/09/2017 1747   LYMPHSABS 1.1 02/09/2017 1747   MONOABS 0.6 02/09/2017 1747   EOSABS 0.1 02/09/2017 1747   BASOSABS 0.0 02/09/2017 1747   Records from his visit with our NP reviewed where he was advised to continue taking Advair BID     Assessment & Plan:   Mild persistent asthma without complication  Hoarseness  Discussion: This is been a stable interval for [place].  He is only using albuterol about 1 time per month right now so he has good control.  Because of the hoarseness he believes which is related  to Symbicort I think it is reasonable to step down therapy.  He is a little nervous about doing this because of the significant improvement he had with Symbicort.  He is willing to step down with 80 in the morning and 160 at night for a while.  He will use his supply of Symbicort 160 at home for the next month or 2 while doing this.  Plan: For your asthma: Take Symbicort 80/4.5  2 puffs in the morning, 160/4.5 2 puffs in the evening until you run out of the 160 dose After you run out of the 160 dose take Symbicort 80/45 2 puffs twice daily Use albuterol as needed  for chest tightness wheezing or shortness of breath Be sure to rinse your mouth after taking the Symbicort  Hoarseness: Follow-up of the gastroesophageal reflux disease lifestyle sheet we gave you Consider seeing an eating ENT physician if this does not improve  We will see you back in 6 months or sooner if needed  > 50% of this 27 minute visit spent face to face   Current Outpatient Medications:  .  acetaminophen (TYLENOL) 500 MG tablet, Take 500 mg by mouth as needed., Disp: , Rfl:  .  albuterol (PROVENTIL HFA;VENTOLIN HFA) 108 (90 BASE) MCG/ACT inhaler, Inhale 2 puffs into the lungs every 6 (six) hours as needed for wheezing or shortness of breath., Disp: 1 Inhaler, Rfl: 3 .  cetirizine (ZYRTEC) 10 MG tablet, Take 10 mg by mouth daily., Disp: , Rfl:  .  Cyanocobalamin (B-12) 1000 MCG/ML KIT, Inject 1,000 mcg as directed every 30 (thirty) days., Disp: 1 kit, Rfl: 12 .  ibuprofen (ADVIL,MOTRIN) 200 MG tablet, Take 200 mg by mouth every 6 (six) hours as needed., Disp: , Rfl:  .  losartan-hydrochlorothiazide (HYZAAR) 100-25 MG tablet, Take 1 tablet by mouth daily., Disp: 90 tablet, Rfl: 0 .  naproxen (NAPROSYN) 375 MG tablet, Take 375 mg by mouth as needed., Disp: , Rfl:  .  potassium chloride (K-DUR) 10 MEQ tablet, Take 1 tablet (10 mEq total) by mouth daily., Disp: 30 tablet, Rfl: 1 .  rosuvastatin (CRESTOR) 5 MG tablet, Take 1 tablet (5 mg total) by mouth daily., Disp: 90 tablet, Rfl: 3 .  budesonide-formoterol (SYMBICORT) 80-4.5 MCG/ACT inhaler, Inhale 2 puffs into the lungs 2 (two) times daily., Disp: 1 Inhaler, Rfl: 11  Current Facility-Administered Medications:  .  0.9 %  sodium chloride infusion, 500 mL, Intravenous, Continuous, Nandigam, Venia Minks, MD

## 2017-04-29 NOTE — Patient Instructions (Signed)
For your asthma: Take Symbicort 80/4.5  2 puffs in the morning, 160/4.5 2 puffs in the evening until you run out of the 160 dose After you run out of the 160 dose take Symbicort 80/45 2 puffs twice daily Use albuterol as needed for chest tightness wheezing or shortness of breath Be sure to rinse your mouth after taking the Symbicort  Hoarseness: Follow-up of the gastroesophageal reflux disease lifestyle sheet we gave you Consider seeing an eating ENT physician if this does not improve  We will see you back in 6 months or sooner if needed

## 2017-07-03 ENCOUNTER — Ambulatory Visit (INDEPENDENT_AMBULATORY_CARE_PROVIDER_SITE_OTHER): Payer: BC Managed Care – PPO

## 2017-07-03 DIAGNOSIS — J309 Allergic rhinitis, unspecified: Secondary | ICD-10-CM

## 2017-07-03 NOTE — Progress Notes (Signed)
Patient was 6 weeks late on injection and has not been seen in office by provider in over 8 years. He has been advised that he must be seen in office and must wait 30 minutes post injection. Per Dr. Nelva Bush he was backed down to 0.3 cc on both red vials and will build up weekly to 0.5 cc and resume regular maintenance injections. He does have an epipen and denied refill due to waste and cost. He also states that he does not think he needs to be on injections anymore and I advised him to speak with the doctor when he comes in for his OV.    Patient did wait 30 minutes post injection and had no local or systemic reactions at time of leaving.

## 2017-07-04 ENCOUNTER — Other Ambulatory Visit: Payer: Self-pay | Admitting: Internal Medicine

## 2017-07-04 DIAGNOSIS — E538 Deficiency of other specified B group vitamins: Secondary | ICD-10-CM

## 2017-07-04 DIAGNOSIS — Z0000 Encounter for general adult medical examination without abnormal findings: Secondary | ICD-10-CM

## 2017-07-04 DIAGNOSIS — I10 Essential (primary) hypertension: Secondary | ICD-10-CM

## 2017-07-04 DIAGNOSIS — N400 Enlarged prostate without lower urinary tract symptoms: Secondary | ICD-10-CM

## 2017-07-04 DIAGNOSIS — Z13220 Encounter for screening for lipoid disorders: Secondary | ICD-10-CM

## 2017-07-05 ENCOUNTER — Other Ambulatory Visit: Payer: Self-pay | Admitting: Internal Medicine

## 2017-07-10 ENCOUNTER — Ambulatory Visit (INDEPENDENT_AMBULATORY_CARE_PROVIDER_SITE_OTHER): Payer: BC Managed Care – PPO | Admitting: *Deleted

## 2017-07-10 DIAGNOSIS — J309 Allergic rhinitis, unspecified: Secondary | ICD-10-CM | POA: Diagnosis not present

## 2017-07-15 ENCOUNTER — Other Ambulatory Visit: Payer: BC Managed Care – PPO | Admitting: Internal Medicine

## 2017-07-15 DIAGNOSIS — I10 Essential (primary) hypertension: Secondary | ICD-10-CM

## 2017-07-15 DIAGNOSIS — Z8639 Personal history of other endocrine, nutritional and metabolic disease: Secondary | ICD-10-CM

## 2017-07-15 DIAGNOSIS — N400 Enlarged prostate without lower urinary tract symptoms: Secondary | ICD-10-CM

## 2017-07-15 DIAGNOSIS — Z13220 Encounter for screening for lipoid disorders: Secondary | ICD-10-CM

## 2017-07-15 DIAGNOSIS — Z0000 Encounter for general adult medical examination without abnormal findings: Secondary | ICD-10-CM

## 2017-07-16 LAB — CBC WITH DIFFERENTIAL/PLATELET
BASOS ABS: 21 {cells}/uL (ref 0–200)
Basophils Relative: 0.5 %
EOS PCT: 2.7 %
Eosinophils Absolute: 111 cells/uL (ref 15–500)
HCT: 41.5 % (ref 38.5–50.0)
Hemoglobin: 14.6 g/dL (ref 13.2–17.1)
Lymphs Abs: 1074 cells/uL (ref 850–3900)
MCH: 34 pg — ABNORMAL HIGH (ref 27.0–33.0)
MCHC: 35.2 g/dL (ref 32.0–36.0)
MCV: 96.7 fL (ref 80.0–100.0)
MONOS PCT: 12.9 %
MPV: 10.4 fL (ref 7.5–12.5)
NEUTROS PCT: 57.7 %
Neutro Abs: 2366 cells/uL (ref 1500–7800)
Platelets: 117 10*3/uL — ABNORMAL LOW (ref 140–400)
RBC: 4.29 10*6/uL (ref 4.20–5.80)
RDW: 13.3 % (ref 11.0–15.0)
TOTAL LYMPHOCYTE: 26.2 %
WBC mixed population: 529 cells/uL (ref 200–950)
WBC: 4.1 10*3/uL (ref 3.8–10.8)

## 2017-07-16 LAB — COMPLETE METABOLIC PANEL WITH GFR
AG RATIO: 1.9 (calc) (ref 1.0–2.5)
ALBUMIN MSPROF: 4.3 g/dL (ref 3.6–5.1)
ALT: 30 U/L (ref 9–46)
AST: 21 U/L (ref 10–35)
Alkaline phosphatase (APISO): 37 U/L — ABNORMAL LOW (ref 40–115)
BILIRUBIN TOTAL: 1.2 mg/dL (ref 0.2–1.2)
BUN: 15 mg/dL (ref 7–25)
CHLORIDE: 101 mmol/L (ref 98–110)
CO2: 29 mmol/L (ref 20–32)
Calcium: 9.5 mg/dL (ref 8.6–10.3)
Creat: 1.1 mg/dL (ref 0.70–1.25)
GFR, EST AFRICAN AMERICAN: 83 mL/min/{1.73_m2} (ref >=60)
GFR, Est Non African American: 72 mL/min/{1.73_m2} (ref >=60)
Globulin: 2.3 g/dL (calc) (ref 1.9–3.7)
Glucose, Bld: 92 mg/dL (ref 65–99)
POTASSIUM: 4 mmol/L (ref 3.5–5.3)
Sodium: 142 mmol/L (ref 135–146)
TOTAL PROTEIN: 6.6 g/dL (ref 6.1–8.1)

## 2017-07-16 LAB — PSA: PSA: 1.7 ng/mL (ref <=4.0)

## 2017-07-16 LAB — LIPID PANEL
Cholesterol: 152 mg/dL (ref <200)
HDL: 50 mg/dL (ref >40)
LDL Cholesterol (Calc): 85 mg/dL (calc)
Non-HDL Cholesterol (Calc): 102 mg/dL (calc) (ref <130)
TRIGLYCERIDES: 81 mg/dL (ref <150)
Total CHOL/HDL Ratio: 3 (calc) (ref <5.0)

## 2017-07-16 LAB — VITAMIN B12: Vitamin B-12: 923 pg/mL (ref 200–1100)

## 2017-07-17 ENCOUNTER — Ambulatory Visit (INDEPENDENT_AMBULATORY_CARE_PROVIDER_SITE_OTHER): Payer: BC Managed Care – PPO | Admitting: Internal Medicine

## 2017-07-17 ENCOUNTER — Encounter: Payer: Self-pay | Admitting: Internal Medicine

## 2017-07-17 VITALS — BP 120/80 | HR 72 | Ht 72.0 in | Wt 212.0 lb

## 2017-07-17 DIAGNOSIS — Z8709 Personal history of other diseases of the respiratory system: Secondary | ICD-10-CM

## 2017-07-17 DIAGNOSIS — I10 Essential (primary) hypertension: Secondary | ICD-10-CM | POA: Diagnosis not present

## 2017-07-17 DIAGNOSIS — E538 Deficiency of other specified B group vitamins: Secondary | ICD-10-CM

## 2017-07-17 DIAGNOSIS — R413 Other amnesia: Secondary | ICD-10-CM

## 2017-07-17 DIAGNOSIS — E876 Hypokalemia: Secondary | ICD-10-CM

## 2017-07-17 DIAGNOSIS — Z0000 Encounter for general adult medical examination without abnormal findings: Secondary | ICD-10-CM | POA: Diagnosis not present

## 2017-07-17 DIAGNOSIS — F419 Anxiety disorder, unspecified: Secondary | ICD-10-CM | POA: Diagnosis not present

## 2017-07-17 DIAGNOSIS — N400 Enlarged prostate without lower urinary tract symptoms: Secondary | ICD-10-CM | POA: Diagnosis not present

## 2017-07-17 DIAGNOSIS — F329 Major depressive disorder, single episode, unspecified: Secondary | ICD-10-CM

## 2017-07-17 DIAGNOSIS — F32A Depression, unspecified: Secondary | ICD-10-CM

## 2017-07-17 LAB — POCT URINALYSIS DIPSTICK
APPEARANCE: NORMAL
BILIRUBIN UA: NEGATIVE
Blood, UA: NEGATIVE
Glucose, UA: NEGATIVE
Ketones, UA: NEGATIVE
LEUKOCYTES UA: NEGATIVE
NITRITE UA: NEGATIVE
ODOR: NORMAL
PH UA: 6.5 (ref 5.0–8.0)
PROTEIN UA: NEGATIVE
Spec Grav, UA: 1.015 (ref 1.010–1.025)
Urobilinogen, UA: 1 E.U./dL

## 2017-07-17 MED ORDER — POTASSIUM CHLORIDE CRYS ER 20 MEQ PO TBCR: 20.0000 meq | EXTENDED_RELEASE_TABLET | Freq: Once | ORAL | 0 refills | Status: DC

## 2017-07-17 NOTE — Progress Notes (Signed)
Subjective:    Patient ID: Melvin Dixon, male    DOB: [birth year], 62 y.o.   MRN: [id number]  HPI 62 year old Male in today for health maintenance exam and evaluation of medical issues.  He brings in multiple blood pressure readings that are stable.  He has a history of essential hypertension, asthma, B12 deficiency and allergic rhinitis.  He gives himself B12 injections.  He is been seen at [facility] for memory loss.  Last year he indicated he wanted to see Dr. Sherre Lain at [facility] but I do not see that he has been seen there yet.  Apparently [facility] was out of network with his insurance.  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  His B12 level done in  conjunction with this visit is normal.  Past medical history: Right lower lobe pneumonia December 1994.  Prostatitis March 2000.  Vasectomy December 1993.  Tetanus immunization update August 2011.  No known drug allergies.  In May 2013 Dr. Sherrye Payor removed basal cell carcinoma along the right  sunburn area.  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  Family history: History of dementia in his mother.  Father died at age 53 after several heart attacks.  One brother in good health.  6 sisters.  Both parents had diabetes.  Father had hypertension.  Some siblings with seizure disorder.  One sister died with lung cancer with history of smoking.  Social history: This is his second marriage.  2 daughters from first marriage.  No children from second marriage.  Wife works at [employer] but has been looking for another job because of work stress.  He has social alcohol consumption and is a non-smoker.  He exercises at [employer] regularly in [place] where they live.   Review of Systems  Constitutional: Negative.   All other systems reviewed and are negative.      Objective:   Physical Exam  Constitutional: He is oriented to  person, place, and time. He appears well-developed and well-nourished. No distress.  HENT:  Head: Normocephalic and atraumatic.  Right Ear: External ear normal.  Left Ear: External ear normal.  Mouth/Throat: Oropharynx is clear and moist.  Eyes: Pupils are equal, round, and reactive to light. Conjunctivae and EOM are normal. Right eye exhibits no discharge. Left eye exhibits no discharge. No scleral icterus.  Neck: Neck supple. No JVD present. No thyromegaly present.  Cardiovascular: Normal rate, regular rhythm, normal heart sounds and intact distal pulses.  No murmur heard. Pulmonary/Chest: Effort normal and breath sounds normal. No respiratory distress. He has no wheezes. He has no rales.  Abdominal: Soft. Bowel sounds are normal. He exhibits no distension and no mass. There is no tenderness. There is no rebound and no guarding.  Genitourinary: Prostate normal.  Musculoskeletal: He exhibits no edema.  Lymphadenopathy:    He has no cervical adenopathy.  Neurological: He is alert and oriented to person, place, and time. He displays normal reflexes. No cranial nerve deficit. Coordination normal.  Skin: Skin is warm and dry. No rash noted. He is not diaphoretic.  Psychiatric: He has a normal mood and affect. His behavior is normal. Judgment and thought content normal.  Vitals reviewed.         Assessment & Plan:  Essential hypertension-stable on current regimen  Hypokalemia-he is on diuretic therapy increase K-Dur from 10 to 20 mEq daily and follow-up in  4 weeks.  B12 deficiency  Memory loss  History of allergic rhinitis and asthma  Plan: Increase K-Dur 20 mEq daily and follow-up with treatment only in 4 weeks.  Will need 5-month recheck in 6 months.

## 2017-07-17 NOTE — Patient Instructions (Signed)
Increase K-Dur from 10-20 mEq daily and follow-up here with b- met in 1 month.  Otherwise return in 6 months for 55-monthrecheck.  Continue same medications.  Continue giving B12 injections monthly at home.

## 2017-07-30 ENCOUNTER — Encounter: Payer: Self-pay | Admitting: *Deleted

## 2017-07-30 NOTE — Progress Notes (Signed)
Maintenance vial made. Exp: 07-31-18. hv 

## 2017-08-06 DIAGNOSIS — J3089 Other allergic rhinitis: Secondary | ICD-10-CM | POA: Diagnosis not present

## 2017-08-12 ENCOUNTER — Ambulatory Visit (INDEPENDENT_AMBULATORY_CARE_PROVIDER_SITE_OTHER): Payer: BC Managed Care – PPO | Admitting: *Deleted

## 2017-08-12 DIAGNOSIS — J309 Allergic rhinitis, unspecified: Secondary | ICD-10-CM | POA: Diagnosis not present

## 2017-08-21 ENCOUNTER — Ambulatory Visit: Payer: BC Managed Care – PPO | Admitting: Allergy & Immunology

## 2017-08-22 ENCOUNTER — Other Ambulatory Visit: Payer: BC Managed Care – PPO | Admitting: Internal Medicine

## 2017-08-26 ENCOUNTER — Other Ambulatory Visit: Payer: BC Managed Care – PPO | Admitting: Internal Medicine

## 2017-08-26 DIAGNOSIS — E876 Hypokalemia: Secondary | ICD-10-CM

## 2017-08-26 LAB — BASIC METABOLIC PANEL
BUN: 15 mg/dL (ref 7–25)
CO2: 30 mmol/L (ref 20–32)
Calcium: 9.4 mg/dL (ref 8.6–10.3)
Chloride: 101 mmol/L (ref 98–110)
Creat: 1.09 mg/dL (ref 0.70–1.25)
Glucose, Bld: 99 mg/dL (ref 65–99)
POTASSIUM: 4.1 mmol/L (ref 3.5–5.3)
Sodium: 139 mmol/L (ref 135–146)

## 2017-08-26 NOTE — Addendum Note (Signed)
Addended by: Mady Haagensen on: 08/26/2017 09:21 AM   Modules accepted: Orders

## 2017-08-28 ENCOUNTER — Encounter: Payer: Self-pay | Admitting: Allergy & Immunology

## 2017-08-28 ENCOUNTER — Ambulatory Visit: Payer: BC Managed Care – PPO | Admitting: Family Medicine

## 2017-08-28 VITALS — BP 118/72 | HR 61 | Resp 17 | Ht 73.5 in | Wt 213.2 lb

## 2017-08-28 DIAGNOSIS — J309 Allergic rhinitis, unspecified: Secondary | ICD-10-CM

## 2017-08-28 DIAGNOSIS — J4530 Mild persistent asthma, uncomplicated: Secondary | ICD-10-CM

## 2017-08-28 NOTE — Patient Instructions (Addendum)
Asthma - Your lung function looked good today - Continue Symbicort 160- 2 puffs twice a day - Continue your rescue inhaler every 4 hours as needed - Follow up with Dr. Lake Bells, Pulmonology specialist as needed for your breathing.   Allergic rhinitis - Continue your daily antihistamine as needed for runny nose, sneezing, or itch - We will begin the allergy shot weaning processBegin receiving your allergen immunotherapy once every 5 weeks.  Continue the other medications as listed in your chart  Call me if this plan is not working well for you  Follow up in 6 months or sooner if needed

## 2017-08-28 NOTE — Progress Notes (Signed)
[address] [place] [address] Dept: [phone]  FOLLOW UP NOTE  Patient ID: Melvin Dixon, male    DOB: 16-Jun-1955  Age: 62 y.o. MRN: [id number] Date of Office Visit: 08/28/2017  Assessment  Chief Complaint: No chief complaint on file.  HPI Melvin Dixon is a 62 year old male who presents to the clinic for a follow up visit. He has been receiving allergy immunotherapy at this clinic beginning in 2007 and is currently on the maintenance dose. He was last evaluated for asthma, allergic rhinoconjunctivitis, and possible reflux. He is followed by Dr. Lake Bells at [facility].  At today's visit, he reports he has been on allergen immunotherapy for over 10 years and he is interested in stopping his shots. He reports allergic rhinitis as well controlled with no nasal congestion or runny nose. He does report frequent throat clearing which has been ongoing for several years. He takes cetirizine 10 mg once a day and is not currently using any medicated nasal sprays. He reports that he experiences more rhinitis when he gets closer to the time for his scheduled allergy injection.   Asthma is reported as moderately well controlled with occasional shortness of breath with exercise. He denies cough or wheeze with activity or rest. He is currently using Symbicort 160- 2 puffs twice a day with a spacer and his albuterol inhaler about once every couple of months.  Reflux is reported as not well controlled. He reports that food can aggravate his heartburn. He is not currently using any medical intervention to control the reflux.   His current medications are listed in the chart.   Drug Allergies:  No Known Allergies  Physical Exam: BP 118/72   Pulse 61   Resp 17   Ht 6' 1.5" (1.867 m)   Wt 213 lb 3.2 oz (96.7 kg)   SpO2 96%   BMI 27.75 kg/m    Physical Exam  Constitutional: He is oriented to person, place, and time. He appears well-developed and well-nourished.  HENT:  Head:  Normocephalic.  Right Ear: External ear normal.  Left Ear: External ear normal.  Mouth/Throat: Oropharynx is clear and moist.  Bilateral nares slightly erythematous and edematous with no nasal drainage noted. Pharynx clear with no exudates. Ears normal. Eyes normal  Eyes: Conjunctivae are normal.  Neck: Normal range of motion. Neck supple.  Cardiovascular: Normal rate, regular rhythm and normal heart sounds.  No murmur noted  Pulmonary/Chest: Effort normal and breath sounds normal.  Lungs clear to auscultation  Musculoskeletal: Normal range of motion.  Neurological: He is alert and oriented to person, place, and time.  Skin: Skin is warm and dry.  Psychiatric: He has a normal mood and affect. His behavior is normal. Judgment and thought content normal.    Diagnostics: FVC 3.83, FEV1 3.07.  Predicted FVC 5.21, predicted FEV1 3.96.  Spirometry reveals mild restriction.  This is consistent with previous pulmonary function evaluation.  Assessment and Plan: 1. Mild persistent asthma without complication   2. Allergic rhinitis, unspecified seasonality, unspecified trigger      Patient Instructions  Asthma - Your lung function looked good today - Continue Symbicort 160- 2 puffs twice a day - Continue your rescue inhaler every 4 hours as needed - Follow up with Dr. Lake Bells, Pulmonology specialist as needed for your breathing.   Allergic rhinitis - Continue your daily antihistamine as needed for runny nose, sneezing, or itch - We will begin the allergy shot weaning processBegin receiving your allergen immunotherapy  once every 5 weeks.  Continue the other medications as listed in your chart  Call me if this plan is not working well for you  Follow up in 6 months or sooner if needed    Return in about 6 months (around 02/28/2018), or if symptoms worsen or fail to improve.    Thank you for the opportunity to care for this patient.  Please do not hesitate to contact me with  questions.  Gareth Morgan, FNP [facility]

## 2017-09-11 ENCOUNTER — Ambulatory Visit (INDEPENDENT_AMBULATORY_CARE_PROVIDER_SITE_OTHER): Payer: BC Managed Care – PPO | Admitting: *Deleted

## 2017-09-11 DIAGNOSIS — J309 Allergic rhinitis, unspecified: Secondary | ICD-10-CM | POA: Diagnosis not present

## 2017-10-22 ENCOUNTER — Ambulatory Visit (INDEPENDENT_AMBULATORY_CARE_PROVIDER_SITE_OTHER): Payer: BC Managed Care – PPO | Admitting: *Deleted

## 2017-10-22 DIAGNOSIS — J309 Allergic rhinitis, unspecified: Secondary | ICD-10-CM | POA: Diagnosis not present

## 2017-10-27 ENCOUNTER — Other Ambulatory Visit: Payer: Self-pay | Admitting: Internal Medicine

## 2017-11-27 ENCOUNTER — Encounter: Payer: Self-pay | Admitting: Internal Medicine

## 2017-12-07 ENCOUNTER — Other Ambulatory Visit: Payer: Self-pay | Admitting: Internal Medicine

## 2017-12-11 ENCOUNTER — Telehealth: Payer: Self-pay | Admitting: Pulmonary Disease

## 2017-12-11 MED ORDER — BUDESONIDE-FORMOTEROL FUMARATE 80-4.5 MCG/ACT IN AERO: 2.0000 | INHALATION_SPRAY | Freq: Two times a day (BID) | RESPIRATORY_TRACT | 0 refills | Status: DC

## 2017-12-11 NOTE — Telephone Encounter (Signed)
Rx has been sent in. Nothing further was needed. 

## 2018-01-09 ENCOUNTER — Other Ambulatory Visit: Payer: Self-pay | Admitting: Internal Medicine

## 2018-01-13 ENCOUNTER — Other Ambulatory Visit: Payer: Self-pay | Admitting: Internal Medicine

## 2018-01-13 DIAGNOSIS — E785 Hyperlipidemia, unspecified: Secondary | ICD-10-CM

## 2018-01-13 DIAGNOSIS — I10 Essential (primary) hypertension: Secondary | ICD-10-CM

## 2018-01-15 ENCOUNTER — Other Ambulatory Visit: Payer: BC Managed Care – PPO | Admitting: Internal Medicine

## 2018-01-15 DIAGNOSIS — E785 Hyperlipidemia, unspecified: Secondary | ICD-10-CM

## 2018-01-15 DIAGNOSIS — I10 Essential (primary) hypertension: Secondary | ICD-10-CM

## 2018-01-16 LAB — BASIC METABOLIC PANEL
BUN: 15 mg/dL (ref 7–25)
CO2: 27 mmol/L (ref 20–32)
Calcium: 9.1 mg/dL (ref 8.6–10.3)
Chloride: 104 mmol/L (ref 98–110)
Creat: 1.05 mg/dL (ref 0.70–1.25)
GLUCOSE: 86 mg/dL (ref 65–99)
Potassium: 4.3 mmol/L (ref 3.5–5.3)
Sodium: 140 mmol/L (ref 135–146)

## 2018-01-16 LAB — LIPID PANEL
Cholesterol: 139 mg/dL (ref <200)
HDL: 50 mg/dL (ref >40)
LDL CHOLESTEROL (CALC): 75 mg/dL
NON-HDL CHOLESTEROL (CALC): 89 mg/dL (ref <130)
TRIGLYCERIDES: 60 mg/dL (ref <150)
Total CHOL/HDL Ratio: 2.8 (calc) (ref <5.0)

## 2018-01-20 ENCOUNTER — Encounter: Payer: Self-pay | Admitting: Internal Medicine

## 2018-01-20 ENCOUNTER — Ambulatory Visit (INDEPENDENT_AMBULATORY_CARE_PROVIDER_SITE_OTHER): Payer: BC Managed Care – PPO | Admitting: Internal Medicine

## 2018-01-20 VITALS — BP 100/70 | HR 60 | Ht 73.5 in | Wt 210.0 lb

## 2018-01-20 DIAGNOSIS — Z8709 Personal history of other diseases of the respiratory system: Secondary | ICD-10-CM | POA: Diagnosis not present

## 2018-01-20 DIAGNOSIS — J0100 Acute maxillary sinusitis, unspecified: Secondary | ICD-10-CM | POA: Diagnosis not present

## 2018-01-20 DIAGNOSIS — I10 Essential (primary) hypertension: Secondary | ICD-10-CM | POA: Diagnosis not present

## 2018-01-20 DIAGNOSIS — E538 Deficiency of other specified B group vitamins: Secondary | ICD-10-CM | POA: Diagnosis not present

## 2018-01-20 DIAGNOSIS — E7800 Pure hypercholesterolemia, unspecified: Secondary | ICD-10-CM

## 2018-01-20 MED ORDER — AZITHROMYCIN 250 MG PO TABS: ORAL_TABLET | ORAL | 0 refills | Status: DC

## 2018-01-20 MED ORDER — LOSARTAN POTASSIUM-HCTZ 50-12.5 MG PO TABS: 1.0000 | ORAL_TABLET | Freq: Every day | ORAL | 1 refills | Status: DC

## 2018-01-20 NOTE — Patient Instructions (Addendum)
Reduce  BP med to Hyzaar 50/12.5 mg daily. Call with blood pressure readings in 2 weeks.  Take Zithromax Z-PAK for acute maxillary sinusitis.  Continue Crestor.  Physical exam booked for April 2020.

## 2018-01-20 NOTE — Progress Notes (Signed)
   Subjective:    Patient ID: Melvin Dixon, male    DOB: [birth year], 62 y.o.   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  History of hypertension, asthma, memory issues.  Says wife has started her own consulting business out of their home. 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 Brings in graph with blood pressure readings which are very acceptable and frankly a bit low.  His systolic is 195 today.  He is asymptomatic with regard to dizziness.  He is come down with a respiratory infection.  Has sinus pressure and nasal congestion.  Flying to [facility] later this week.    Review of Systems no fever or shaking chills.  Beginning to cough up some discolored sputum.  Maxillary sinus tenderness bilaterally     Objective:   Physical Exam  Constitutional: He appears well-developed and well-nourished. No distress.  Neck: No JVD present. No thyromegaly present.  Cardiovascular: Normal rate, regular rhythm and normal heart sounds.  No murmur heard. Pulmonary/Chest: Effort normal and breath sounds normal. No respiratory distress. He has no wheezes.  Musculoskeletal: He exhibits no edema.  Lymphadenopathy:    He has no cervical adenopathy.  Skin: Skin is warm and dry. He is not diaphoretic.  Vitals reviewed.  BP 100/70.       Assessment & Plan:  Essential HTN- stable and quite frankly readings he brings in are low normal- Reduce  Hyzaar to 50/12.5 mg daily.   He is to call with blood pressure readings in couple of weeks.  Basic metabolic panel is normal so electrolytes, BUN and creatinine will likely remain normal on lower dose of Hyzaar.  Acute maxillary sinusitis-treat with Zithromax Z-PAK 2 tablets p.o. day 1 followed by 1 p.o. days 2 through 5  Hx asthma- not wheezing-has inhalers if needed  Plan: Zithromax Z-Pak take 2 tablets day 1 followed by 1 tablet days 2 through 5.   Reduce Hyzaar to 50/12.5 mg  daily and monitor blood pressure at home.  Call with blood pressure readings in 2 weeks.  History of memory loss-patient says it seems to be stable and perhaps improved a bit.  Has been seen at [facility] but not recently.  Family history of dementia in mother.  Apparently [facility] is out of network with his insurance.  He mentioned going to Dr. Terie Purser at [facility] but I do not think he ever went.  He was treated with Zoloft at [facility].  Was never prescribed Aricept or Namenda.  History of B12 deficiency-gives himself B12 1 cc IM monthly.  B12 level in April was normal.  Hyperlipidemia treated with low-dose Crestor and lipid panel in April was normal  Physical exam booked April 2020    25 minutes spent with patient

## 2018-01-23 ENCOUNTER — Telehealth: Payer: Self-pay | Admitting: Internal Medicine

## 2018-01-23 MED ORDER — HYDROCODONE-HOMATROPINE 5-1.5 MG/5ML PO SYRP: 5.0000 mL | ORAL_SOLUTION | Freq: Three times a day (TID) | ORAL | 0 refills | Status: DC | PRN

## 2018-01-23 NOTE — Telephone Encounter (Signed)
Call him and tell him Hycodan has been called in. Take cough drops with him on trip also.

## 2018-01-23 NOTE — Telephone Encounter (Addendum)
States that he feels that antibiotic is helping him to make some progress; however, the cough is still terrible.  He wants to know if you will call him in something to help with the cough.  He is flying to [facility] tomorrow and he's concerned about the cough and how bad it is and traveling.    Pharmacy:  [facility]  Phone:  [phone]  Calling in [place]

## 2018-01-23 NOTE — Telephone Encounter (Signed)
Pt was notified of results and instructions, pt verbalized understanding.   

## 2018-01-25 ENCOUNTER — Encounter: Payer: Self-pay | Admitting: Internal Medicine

## 2018-02-11 ENCOUNTER — Other Ambulatory Visit: Payer: Self-pay | Admitting: Internal Medicine

## 2018-02-12 ENCOUNTER — Ambulatory Visit (INDEPENDENT_AMBULATORY_CARE_PROVIDER_SITE_OTHER): Payer: BC Managed Care – PPO | Admitting: Internal Medicine

## 2018-02-12 DIAGNOSIS — Z23 Encounter for immunization: Secondary | ICD-10-CM

## 2018-02-12 NOTE — Patient Instructions (Addendum)
Patient received a flu vaccine IM R deltoid, AV, CMA  

## 2018-02-12 NOTE — Progress Notes (Signed)
Flu vaccine given by CMA 

## 2018-03-11 ENCOUNTER — Encounter: Payer: Self-pay | Admitting: Pulmonary Disease

## 2018-03-11 ENCOUNTER — Ambulatory Visit: Payer: BC Managed Care – PPO | Admitting: Pulmonary Disease

## 2018-03-11 VITALS — BP 118/76 | HR 68 | Ht 73.0 in | Wt 208.0 lb

## 2018-03-11 DIAGNOSIS — J4540 Moderate persistent asthma, uncomplicated: Secondary | ICD-10-CM | POA: Diagnosis not present

## 2018-03-11 DIAGNOSIS — J301 Allergic rhinitis due to pollen: Secondary | ICD-10-CM | POA: Diagnosis not present

## 2018-03-11 MED ORDER — MONTELUKAST SODIUM 10 MG PO TABS: 10.0000 mg | ORAL_TABLET | Freq: Every day | ORAL | 3 refills | Status: DC

## 2018-03-11 MED ORDER — BUDESONIDE-FORMOTEROL FUMARATE 160-4.5 MCG/ACT IN AERO: 2.0000 | INHALATION_SPRAY | Freq: Two times a day (BID) | RESPIRATORY_TRACT | 5 refills | Status: DC

## 2018-03-11 MED ORDER — ALBUTEROL SULFATE HFA 108 (90 BASE) MCG/ACT IN AERS: 2.0000 | INHALATION_SPRAY | Freq: Four times a day (QID) | RESPIRATORY_TRACT | 3 refills | Status: DC | PRN

## 2018-03-11 MED ORDER — BUDESONIDE-FORMOTEROL FUMARATE 160-4.5 MCG/ACT IN AERO: 2.0000 | INHALATION_SPRAY | Freq: Two times a day (BID) | RESPIRATORY_TRACT | 0 refills | Status: DC

## 2018-03-11 NOTE — Progress Notes (Signed)
Subjective:    Patient ID: Melvin Dixon, male    DOB: [birth year], 62 y.o.   MRN: [id number]  Synopsis: referred in 2018 for evaluation of dyspnea in the setting of asbestos exposure  HPI Chief Complaint  Patient presents with  . Follow-up    using rescue more frequently- some weeks everyday, throat clearing      He has a lot of fall allergies and is clearing his throat a lot.  He doesn't have sinus congestion or post nasal drip consistently but he did have a recent sinus infection.  He had some cough with that.  Since dropping the dose of symbicort he has used albuterol more frequently.  He has been exercising quite a bit lately with a cardio, resistance training and has noticed dropping total work effort lately.  He feels more short of breath with the exercises.  He is typically fasted.   Past Medical History:  Diagnosis Date  . Allergy   . Anxiety    2 years ago no longer on meds  Zoloft  . Arthritis    osteoarthritis  . Asthma   . Basal cell carcinoma   . Cancer ([place])   . Hyperlipidemia   . Hypertension   . Memory loss   . Vision abnormalities    migraines        Review of Systems  Constitutional: Negative for fever and unexpected weight change.  HENT: Negative for congestion, dental problem, ear pain, nosebleeds, postnasal drip, rhinorrhea, sinus pressure, sneezing, sore throat and trouble swallowing.   Eyes: Negative for redness and itching.  Respiratory: Positive for cough, chest tightness and shortness of breath. Negative for wheezing.   Cardiovascular: Negative for palpitations and leg swelling.  Gastrointestinal: Negative for nausea and vomiting.  Genitourinary: Negative for dysuria.  Musculoskeletal: Negative for joint swelling.  Skin: Negative for rash.  Neurological: Negative for headaches.  Hematological: Does not bruise/bleed easily.  Psychiatric/Behavioral: Negative for dysphoric mood. The patient is not nervous/anxious.        Objective:   Physical  Exam Vitals:   03/11/18 0911  BP: 118/76  Pulse: 68  SpO2: 97%  Weight: 208 lb (94.3 kg)  Height: 6\' 1"  (1.854 m)    Gen: well appearing HENT: OP clear, TM's clear, neck supple PULM: CTA B, normal percussion CV: RRR, no mgr, trace edema GI: BS+, soft, nontender Derm: no cyanosis or rash Psyche: normal mood and affect    Chest imaging: 2008 chest x-ray images independently reviewed showing what appears to be normal pulmonary parenchyma, perhaps slight hyperinflation 2018 CXR images reviewed showing normal pulmonary parenchyma, no calcified plaques  Pulmonary function testing: Ratio normal, FEV1 3.44 L 84% predicted, FVC 4.47 L 82% predicted, total lung capacity 7.06 L 90% predicted, DLCO 38.1 mL 100% predicted  CBC    Component Value Date/Time   WBC 4.1 07/15/2017 0914   RBC 4.29 07/15/2017 0914   HGB 14.6 07/15/2017 0914   HCT 41.5 07/15/2017 0914   PLT 117 (L) 07/15/2017 0914   MCV 96.7 07/15/2017 0914   MCH 34.0 (H) 07/15/2017 0914   MCHC 35.2 07/15/2017 0914   RDW 13.3 07/15/2017 0914   LYMPHSABS 1,074 07/15/2017 0914   MONOABS 0.6 02/09/2017 1747   EOSABS 111 07/15/2017 0914   BASOSABS 21 07/15/2017 0914        Assessment & Plan:   Moderate persistent allergic asthma  Allergic rhinitis due to pollen, unspecified seasonality  Discussion: Melvin Dixon's asthma is less well controlled since we  decrease Symbicort from 160-80.  Fortunately he has not had an exacerbation but he has had less well controlled based on his albuterol use.  He also has some mucus in his throat related to allergies, he does not think this is related to sinus congestion.  Plan:  Allergic congestion in your throat: Allergic rhinitis Start taking montelukast 10 mg daily, you can likely stop this in January Continue cetirizine daily  Moderate persistent asthma: Worse control recently Increase Symbicort 160 2 puffs twice a day Continue using albuterol as needed for chest tightness wheezing  and shortness of breath, I recommend using it 10 minutes prior to exercise Practice good hand hygiene I am glad your immunizations are up-to-date Remain active  Follow-up with me in 6 months or sooner if needed   Current Outpatient Medications:  .  acetaminophen (TYLENOL) 500 MG tablet, Take 500 mg by mouth as needed., Disp: , Rfl:  .  albuterol (PROVENTIL HFA;VENTOLIN HFA) 108 (90 BASE) MCG/ACT inhaler, Inhale 2 puffs into the lungs every 6 (six) hours as needed for wheezing or shortness of breath., Disp: 1 Inhaler, Rfl: 3 .  budesonide-formoterol (SYMBICORT) 80-4.5 MCG/ACT inhaler, Inhale 2 puffs into the lungs 2 (two) times daily., Disp: 1 Inhaler, Rfl: 0 .  cetirizine (ZYRTEC) 10 MG tablet, Take 10 mg by mouth daily., Disp: , Rfl:  .  cyanocobalamin (,VITAMIN B-12,) 1000 MCG/ML injection, INJEACT 1 ML IN MUSCLE SAME DAY EVERY MONTH, Disp: 10 mL, Rfl: 1 .  losartan-hydrochlorothiazide (HYZAAR) 50-12.5 MG tablet, Take 1 tablet by mouth daily., Disp: 30 tablet, Rfl: 1 .  potassium chloride SA (K-DUR,KLOR-CON) 20 MEQ tablet, TAKE 1 TABLET BY MOUTH ONCE DAILY, Disp: 90 tablet, Rfl: 0 .  rosuvastatin (CRESTOR) 5 MG tablet, TAKE 1 TABLET BY MOUTH ONCE DAILY, Disp: 90 tablet, Rfl: 3  Current Facility-Administered Medications:  .  0.9 %  sodium chloride infusion, 500 mL, Intravenous, Continuous, Nandigam, Venia Minks, MD

## 2018-03-11 NOTE — Patient Instructions (Addendum)
Allergic congestion in your throat:  Start taking montelukast 10 mg daily, you can likely stop this in January Continue cetirizine daily  Moderate persistent asthma: Worse control recently Increase Symbicort 160 2 puffs twice a day Continue using albuterol as needed for chest tightness wheezing and shortness of breath, I recommend using it 10 minutes prior to exercise Practice good hand hygiene I am glad your immunizations are up-to-date Remain active  Follow-up with me in 6 months or sooner if needed

## 2018-04-01 ENCOUNTER — Other Ambulatory Visit: Payer: Self-pay | Admitting: Internal Medicine

## 2018-04-02 NOTE — Telephone Encounter (Signed)
He only needs one of these RX Please call pharmacy and fix

## 2018-04-09 ENCOUNTER — Other Ambulatory Visit: Payer: Self-pay | Admitting: Internal Medicine

## 2018-04-14 ENCOUNTER — Other Ambulatory Visit: Payer: Self-pay | Admitting: Internal Medicine

## 2018-05-12 ENCOUNTER — Ambulatory Visit: Payer: BC Managed Care – PPO | Admitting: Internal Medicine

## 2018-05-12 ENCOUNTER — Encounter: Payer: Self-pay | Admitting: Internal Medicine

## 2018-05-12 VITALS — BP 102/70 | HR 74 | Temp 98.6°F | Ht 73.0 in | Wt 219.0 lb

## 2018-05-12 DIAGNOSIS — H9313 Tinnitus, bilateral: Secondary | ICD-10-CM | POA: Diagnosis not present

## 2018-05-12 DIAGNOSIS — H6503 Acute serous otitis media, bilateral: Secondary | ICD-10-CM | POA: Diagnosis not present

## 2018-05-12 MED ORDER — METHYLPREDNISOLONE ACETATE 80 MG/ML IJ SUSP
80.0000 mg | Freq: Once | INTRAMUSCULAR | Status: AC
  Administered 2018-05-12: 80 mg via INTRAMUSCULAR

## 2018-05-12 NOTE — Progress Notes (Signed)
   Subjective:    Patient ID: Melvin Dixon, male    DOB: [birth year], 63 y.o.   MRN: [id number]  HPI Has noticed some constant ringing in his ears recently.  He has not had a recent URI but has noticed some changes to the airflow system in his building at work.    Review of Systems see above no headache or sore throat.  No fever or chills.  History of memory loss.     Objective:   Physical Exam Both TMs are full bilaterally.  His hearing was tested with an Audioscope II device and was found to have normal hearing at 40 dB with the exception of missing 1000 Hz in the left ear at 40 dB.       Assessment & Plan:  Bilateral serous otitis media  Fairly normal hearing for adult  Plan: Depo-Medrol 80 mg IM to treat serous otitis media.  He will see if they can make some adjustments to noisy airflow in his office.  If symptoms persist we will refer him to ENT physician.

## 2018-05-12 NOTE — Patient Instructions (Signed)
Depo-Medrol 80 mg IM.  Hearing essentially test normal at 40 dB.  Return PRN.

## 2018-07-06 ENCOUNTER — Other Ambulatory Visit: Payer: Self-pay | Admitting: Internal Medicine

## 2018-07-16 ENCOUNTER — Other Ambulatory Visit: Payer: BC Managed Care – PPO | Admitting: Internal Medicine

## 2018-07-17 ENCOUNTER — Other Ambulatory Visit: Payer: Self-pay

## 2018-07-17 ENCOUNTER — Other Ambulatory Visit: Payer: BC Managed Care – PPO | Admitting: Internal Medicine

## 2018-07-17 VITALS — Temp 98.5°F

## 2018-07-17 DIAGNOSIS — F32A Depression, unspecified: Secondary | ICD-10-CM

## 2018-07-17 DIAGNOSIS — R413 Other amnesia: Secondary | ICD-10-CM

## 2018-07-17 DIAGNOSIS — J45909 Unspecified asthma, uncomplicated: Secondary | ICD-10-CM

## 2018-07-17 DIAGNOSIS — F419 Anxiety disorder, unspecified: Secondary | ICD-10-CM

## 2018-07-17 DIAGNOSIS — Z0000 Encounter for general adult medical examination without abnormal findings: Secondary | ICD-10-CM

## 2018-07-17 DIAGNOSIS — F329 Major depressive disorder, single episode, unspecified: Secondary | ICD-10-CM

## 2018-07-17 DIAGNOSIS — E7800 Pure hypercholesterolemia, unspecified: Secondary | ICD-10-CM

## 2018-07-17 DIAGNOSIS — I10 Essential (primary) hypertension: Secondary | ICD-10-CM

## 2018-07-17 DIAGNOSIS — Z125 Encounter for screening for malignant neoplasm of prostate: Secondary | ICD-10-CM

## 2018-07-17 LAB — CBC WITH DIFFERENTIAL/PLATELET
Absolute Monocytes: 543 cells/uL (ref 200–950)
Basophils Absolute: 11 cells/uL (ref 0–200)
Basophils Relative: 0.3 %
Eosinophils Absolute: 72 cells/uL (ref 15–500)
Eosinophils Relative: 1.9 %
HCT: 42.2 % (ref 38.5–50.0)
Hemoglobin: 14.9 g/dL (ref 13.2–17.1)
Lymphs Abs: 1148 cells/uL (ref 850–3900)
MCH: 34.8 pg — ABNORMAL HIGH (ref 27.0–33.0)
MCHC: 35.3 g/dL (ref 32.0–36.0)
MCV: 98.6 fL (ref 80.0–100.0)
MPV: 10.2 fL (ref 7.5–12.5)
Monocytes Relative: 14.3 %
Neutro Abs: 2025 cells/uL (ref 1500–7800)
Neutrophils Relative %: 53.3 %
Platelets: 118 10*3/uL — ABNORMAL LOW (ref 140–400)
RBC: 4.28 10*6/uL (ref 4.20–5.80)
RDW: 13.3 % (ref 11.0–15.0)
Total Lymphocyte: 30.2 %
WBC: 3.8 10*3/uL (ref 3.8–10.8)

## 2018-07-17 LAB — COMPLETE METABOLIC PANEL WITH GFR
AG Ratio: 2.4 (calc) (ref 1.0–2.5)
ALT: 26 U/L (ref 9–46)
AST: 17 U/L (ref 10–35)
Albumin: 4.6 g/dL (ref 3.6–5.1)
Alkaline phosphatase (APISO): 37 U/L (ref 35–144)
BUN: 16 mg/dL (ref 7–25)
CO2: 30 mmol/L (ref 20–32)
Calcium: 9.3 mg/dL (ref 8.6–10.3)
Chloride: 102 mmol/L (ref 98–110)
Creat: 1.07 mg/dL (ref 0.70–1.25)
GFR, Est African American: 85 mL/min/{1.73_m2} (ref >=60)
GFR, Est Non African American: 73 mL/min/{1.73_m2} (ref >=60)
Globulin: 1.9 g/dL (calc) (ref 1.9–3.7)
Glucose, Bld: 93 mg/dL (ref 65–99)
Potassium: 4 mmol/L (ref 3.5–5.3)
Sodium: 140 mmol/L (ref 135–146)
Total Bilirubin: 1 mg/dL (ref 0.2–1.2)
Total Protein: 6.5 g/dL (ref 6.1–8.1)

## 2018-07-17 LAB — LIPID PANEL
Cholesterol: 153 mg/dL (ref <200)
HDL: 52 mg/dL (ref >=40)
LDL Cholesterol (Calc): 82 mg/dL (calc)
Non-HDL Cholesterol (Calc): 101 mg/dL (calc) (ref <130)
Total CHOL/HDL Ratio: 2.9 (calc) (ref <5.0)
Triglycerides: 96 mg/dL (ref <150)

## 2018-07-17 LAB — PSA: PSA: 2 ng/mL (ref <=4.0)

## 2018-07-21 ENCOUNTER — Other Ambulatory Visit: Payer: Self-pay

## 2018-07-21 ENCOUNTER — Ambulatory Visit (INDEPENDENT_AMBULATORY_CARE_PROVIDER_SITE_OTHER): Payer: BC Managed Care – PPO | Admitting: Internal Medicine

## 2018-07-21 VITALS — BP 124/80

## 2018-07-21 DIAGNOSIS — E7800 Pure hypercholesterolemia, unspecified: Secondary | ICD-10-CM

## 2018-07-21 DIAGNOSIS — R413 Other amnesia: Secondary | ICD-10-CM | POA: Diagnosis not present

## 2018-07-21 DIAGNOSIS — Z0000 Encounter for general adult medical examination without abnormal findings: Secondary | ICD-10-CM | POA: Diagnosis not present

## 2018-07-21 DIAGNOSIS — E538 Deficiency of other specified B group vitamins: Secondary | ICD-10-CM

## 2018-07-21 DIAGNOSIS — I10 Essential (primary) hypertension: Secondary | ICD-10-CM

## 2018-07-21 DIAGNOSIS — Z8709 Personal history of other diseases of the respiratory system: Secondary | ICD-10-CM

## 2018-07-21 MED ORDER — MONTELUKAST SODIUM 10 MG PO TABS: 10.0000 mg | ORAL_TABLET | Freq: Every day | ORAL | 3 refills | Status: DC

## 2018-07-21 MED ORDER — BUDESONIDE-FORMOTEROL FUMARATE 160-4.5 MCG/ACT IN AERO: 2.0000 | INHALATION_SPRAY | Freq: Two times a day (BID) | RESPIRATORY_TRACT | 11 refills | Status: DC

## 2018-08-08 ENCOUNTER — Encounter: Payer: Self-pay | Admitting: Internal Medicine

## 2018-08-08 NOTE — Patient Instructions (Signed)
It was a pleasure to see you today by virtual visit.  Continue same medications.  Follow-up in 6 to 12 months or as needed.

## 2018-08-08 NOTE — Progress Notes (Signed)
Subjective:    Patient ID: Melvin Dixon, male    DOB: [birth year], 63 y.o.   MRN: [id number]  HPI 63 year old Male in today for health maintenance exam and evaluation of medical issues.  Due to the COVID-19 pandemic, this visit was performed virtually.  Patient gave consent for virtual visit.  Interactive audio and video telecommunications were achieved between this provider and patient.  2 identifiers were used and patient was identified as Melvin Dixon, a longstanding patient in this practice.  He has a history of essential hypertension, asthma, B12 deficiency and allergic rhinitis.  He gives himself monthly B12 injections.  He has been seen at [facility] for memory loss but not recently.  Past medical history: Right lower lobe pneumonia December 1994.  Prostatitis March 2000.  Vasectomy December 1993.  Tetanus immunization update August 2011.  No known drug allergies.  History of basal cell carcinoma removed by Dr. Sherrye Payor May 2013 along the right sideburn area.  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  Subsequently had basal cell carcinoma removed from left posterior shoulder at [facility] by Dr. Elvera Lennox August 2019.  He has a history of asthma.  He takes Singulair and this will be refilled.  Social history: Married twice.  2 daughters from first marriage.  No children from second marriage.  Wife has started her own consulting business and is working from home.  She formally worked at [employer].  He has a history of social alcohol consumption.  He is a non-smoker.  Prior to the pandemic he was exercising at [employer] in [place] where he resides.  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  Family history: History of dementia in his mother.  Father died at age 74 after several heart attacks.  One brother in good health.  6 sisters.   Both parents had diabetes mellitus.  Father had hypertension.  Some siblings with seizure disorder.  One sister died with lung cancer with history of smoking.  He has a graft of his blood pressure readings and it shows excellent control for the past several weeks.  His lab work was reviewed.  His lipid panel is completely normal including total cholesterol and LDL cholesterol triglycerides.  His HDL was 52.  CBC is within normal limits with the exception of platelet count of 118,000.  He typically runs a slightly low platelet count for a number of years which is of no concern.  Fasting glucose is normal.  BUN and creatinine are normal.  Sodium, potassium, serum proteins, liver functions all within normal limits.  PSA is normal at 2.0.         Review of Systems  Constitutional: Negative.   Respiratory: Negative.   Cardiovascular: Negative.   Gastrointestinal: Negative.   Genitourinary: Negative.   Musculoskeletal: Negative.   All other systems reviewed and are negative.      Objective:   Physical Exam Per his home readings, his blood pressure is normal on current regimen.  He looks well.  His affect is normal.  His memory was not tested today.  However it is recognized that he has mild memory deficits but he is still able to work full-time.  He is alert and oriented and gives a clear concise history of his health over the past year.  He denies chest pain or shortness of breath.  No issues  with bowels or bladder.      Assessment & Plan:  Essential hypertension-stable on current regimen of losartan HCTZ  Hyperlipidemia treated with Crestor 5 mg daily  History of asthma- has Proventil and Symbicort inhalers  History of tinnitus for which he saw me in January 2020 and was reassured that there was very little to do for this rather than just put up with it.  History of allergic rhinitis treated with Zyrtec  History of memory loss with family history of dementia.  Issue seems  stable at the present time and he is able to work full-time.  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  Labs reviewed and are within normal limits.  Continue same medications and return in 6 to 12 months or as needed.

## 2018-08-17 ENCOUNTER — Other Ambulatory Visit: Payer: Self-pay | Admitting: Internal Medicine

## 2018-10-13 ENCOUNTER — Other Ambulatory Visit: Payer: Self-pay | Admitting: Internal Medicine

## 2019-01-04 ENCOUNTER — Ambulatory Visit: Payer: BC Managed Care – PPO | Admitting: Internal Medicine

## 2019-01-04 ENCOUNTER — Telehealth: Payer: Self-pay

## 2019-01-04 NOTE — Telephone Encounter (Signed)
Patient called complains of a feeling that something is in his eye right eye, and change in his vocal cords.  Call back number [phone]

## 2019-01-04 NOTE — Telephone Encounter (Signed)
Needs to call eye doctor. Set up virtual visit about hoarseness Tuesday as he requested.

## 2019-01-05 ENCOUNTER — Other Ambulatory Visit: Payer: Self-pay

## 2019-01-05 ENCOUNTER — Encounter: Payer: Self-pay | Admitting: Internal Medicine

## 2019-01-05 ENCOUNTER — Ambulatory Visit (INDEPENDENT_AMBULATORY_CARE_PROVIDER_SITE_OTHER): Payer: BC Managed Care – PPO | Admitting: Internal Medicine

## 2019-01-05 DIAGNOSIS — R490 Dysphonia: Secondary | ICD-10-CM | POA: Diagnosis not present

## 2019-01-05 DIAGNOSIS — I10 Essential (primary) hypertension: Secondary | ICD-10-CM

## 2019-01-05 DIAGNOSIS — E538 Deficiency of other specified B group vitamins: Secondary | ICD-10-CM

## 2019-01-05 DIAGNOSIS — J3089 Other allergic rhinitis: Secondary | ICD-10-CM

## 2019-01-05 DIAGNOSIS — E785 Hyperlipidemia, unspecified: Secondary | ICD-10-CM

## 2019-01-05 DIAGNOSIS — Z8709 Personal history of other diseases of the respiratory system: Secondary | ICD-10-CM

## 2019-01-05 DIAGNOSIS — R413 Other amnesia: Secondary | ICD-10-CM

## 2019-01-05 NOTE — Patient Instructions (Signed)
To see ENT physician regarding chronic persistent hoarseness.  He is to check with his ophthalmologist regarding issue with right eyelid.  Follow-up here in October for hyperlipidemia and essential hypertension.  Suggest to restart immunotherapy for allergic rhinitis.

## 2019-01-05 NOTE — Progress Notes (Signed)
Subjective:    Patient ID: Melvin Dixon, male    DOB: 1955/12/29, 63 y.o.   MRN: [id number]  HPI   63 year old Male having some issues with hoarseness for several months. Does not feel this is reflux. Has seen Allergy and Asthma for immunotherapy but stopped taking immunotherapy when COVID pandemic came.  He has a history of asthma and is seeing Dr. Lake Bells.  He is on Symbicort.  He is a non-smoker.  Social alcohol consumption.  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  Voice does not hold up for lectures at times.  He does not actually get laryngitis but begins to sound hoarse and raspy.  No known COVID exposure.  Teaching some in person classes.  He has a history of hypertension and hyperlipidemia he is on low-dose Crestor, losartan HCTZ and Symbicort 160/4.52 sprays twice daily.  He has an albuterol inhaler which he uses on a as needed basis.  He has a history of vitamin B12 deficiency and gives himself B12 injections monthly.  He has tried Zyrtec for allergy symptoms and is on Singulair.  He takes a potassium supplement.  Does not take PPI and has not had significant reflux symptoms in the past.  He has a history of mild memory disturbance.  He has noticed some issue with his right eyelid seeming to catch at times on the eye itself.  He needs to see ophthalmologist.  Has been to see [facility] in the past.  He is not sure if they are still on the [facility] plan but he will check.  He may need a blepharoplasty.  He is due for 22-month recheck in October.  We will make an appointment.  Dr. Lake Bells is his Pulmonologist.  He last saw him in November 2019.  At that time he was clearing his throat a lot and having fall allergies.  FEV1 was 84% of predicted.  Was advised to increase Symbicort to 2 sprays twice a day.  Started on Singulair 10 mg daily to take 3 January.  Continue Zyrtec.  Due to the Coronavirus pandemic, he is seen by interactive audio and video telecommunications  today.  He is identified using 2 identifiers as Melvin Dixon, a longstanding patient in this practice.  He is agreeable to visit in this format today.    Review of Systems no fever chills or productive cough.  No complaint of reflux or water brash.  Denies much sputum production.  No sinus tenderness     Objective:   Physical Exam  He monitors his blood pressure frequently and notes that it has been under good control.  He sounds hoarse when speaking and it seems to get worse during the interview.  I am unable to see the issue with his right upper eyelid but suspect it has to do with sagging right upper eyelid.  He will contact ophthalmologist.  Does not appear to be short of breath.  Is not wheezing on interview.      Assessment & Plan:  History of asthma treated by Dr. Lake Bells  History of allergic rhinitis-was receiving immunotherapy before COVID-19 outbreak but has stopped getting immunotherapy.  Does not look like he has been to allergy and asthma for a year with last night in epic being July 2019.  He last saw Dr. Lake Bells November 2019.  Essential hypertension-stable on current regimen.  He is on losartan but do not think the issue is cough but rather hoarseness.  Right eyelid  issue-to check with his ophthalmologist.  May need blepharoplasty.  Hoarseness-I think it could be either GE reflux,  vocal cord disturbance, allergic rhinitis.  He will see ENT physician for evaluation.  Health maintenance: To have 83-month recheck here in October.  25 minutes spent with patient and reviewing medical records as well as medical decision making.

## 2019-01-14 ENCOUNTER — Other Ambulatory Visit: Payer: Self-pay | Admitting: Internal Medicine

## 2019-01-21 ENCOUNTER — Other Ambulatory Visit: Payer: Self-pay

## 2019-01-21 ENCOUNTER — Other Ambulatory Visit: Payer: BC Managed Care – PPO | Admitting: Internal Medicine

## 2019-01-21 DIAGNOSIS — E782 Mixed hyperlipidemia: Secondary | ICD-10-CM

## 2019-01-21 DIAGNOSIS — Z5181 Encounter for therapeutic drug level monitoring: Secondary | ICD-10-CM

## 2019-01-21 LAB — LIPID PANEL
Cholesterol: 148 mg/dL (ref <200)
HDL: 51 mg/dL (ref >=40)
LDL Cholesterol (Calc): 82 mg/dL (calc)
Non-HDL Cholesterol (Calc): 97 mg/dL (calc) (ref <130)
Total CHOL/HDL Ratio: 2.9 (calc) (ref <5.0)
Triglycerides: 70 mg/dL (ref <150)

## 2019-01-21 LAB — HEPATIC FUNCTION PANEL
AG Ratio: 2.3 (calc) (ref 1.0–2.5)
ALT: 27 U/L (ref 9–46)
AST: 21 U/L (ref 10–35)
Albumin: 4.3 g/dL (ref 3.6–5.1)
Alkaline phosphatase (APISO): 36 U/L (ref 35–144)
Bilirubin, Direct: 0.2 mg/dL (ref 0.0–0.2)
Globulin: 1.9 g/dL (calc) (ref 1.9–3.7)
Indirect Bilirubin: 0.6 mg/dL (calc) (ref 0.2–1.2)
Total Bilirubin: 0.8 mg/dL (ref 0.2–1.2)
Total Protein: 6.2 g/dL (ref 6.1–8.1)

## 2019-01-26 ENCOUNTER — Ambulatory Visit (INDEPENDENT_AMBULATORY_CARE_PROVIDER_SITE_OTHER): Payer: BC Managed Care – PPO | Admitting: Internal Medicine

## 2019-01-26 ENCOUNTER — Other Ambulatory Visit: Payer: Self-pay

## 2019-01-26 ENCOUNTER — Encounter: Payer: Self-pay | Admitting: Internal Medicine

## 2019-01-26 VITALS — BP 100/70 | HR 71 | Temp 98.0°F | Ht 73.0 in | Wt 210.0 lb

## 2019-01-26 DIAGNOSIS — Z8709 Personal history of other diseases of the respiratory system: Secondary | ICD-10-CM | POA: Diagnosis not present

## 2019-01-26 DIAGNOSIS — I10 Essential (primary) hypertension: Secondary | ICD-10-CM

## 2019-01-26 DIAGNOSIS — E7800 Pure hypercholesterolemia, unspecified: Secondary | ICD-10-CM

## 2019-01-26 DIAGNOSIS — R413 Other amnesia: Secondary | ICD-10-CM | POA: Diagnosis not present

## 2019-01-26 DIAGNOSIS — E538 Deficiency of other specified B group vitamins: Secondary | ICD-10-CM

## 2019-01-26 DIAGNOSIS — R490 Dysphonia: Secondary | ICD-10-CM

## 2019-01-26 DIAGNOSIS — J3089 Other allergic rhinitis: Secondary | ICD-10-CM

## 2019-01-26 NOTE — Progress Notes (Signed)
   Subjective:    Patient ID: Melvin Dixon, male    DOB: [birth year], 63 y.o.   MRN: [id number]  HPI 63 year old Male for 6 month follow up. CPE booked for April 2021.  History of essential hypertension and hyperlipidemia.  He is on statin medication 5 mg nightly.  He has a history of asthma and is on Singulair and uses Symbicort and Proventil inhalers.  For hypertension he is on losartan HCTZ and a potassium supplement for hypokalemia.  He has an appointment to see ophthalmologist regarding sticking of his left upper eyelid.  He also is seeing ENT physician this afternoon regarding hoarseness.  History of mild memory loss which is stable.  This runs in his family.  He is on B12 injections for B12 deficiency.  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  Denies GE reflux symptoms.     Objective:   Physical Exam Blood pressure 100/70, pulse 71 temperature 98 degrees, pulse oximetry 98% weight 210 pounds BMI 27.71.  Skin warm and dry. Nodes none.  Neck is supple without JVD thyromegaly or carotid bruits.  He does sound hoarse today when he speaks and clears his throat frequently.  Chest is clear to auscultation.  Cardiac exam regular rate and rhythm normal S1 and S2.      Assessment & Plan:  Hoarseness which is persistent-needs to see ENT physician.  Could be allergy related.  He does have allergist.  Denies GE reflux symptoms.  Essential hypertension stable on current regimen  Hyperlipidemia-stable on low-dose statin  History of memory loss-stable and being monitored.  He is on B12 injections monthly.  Plan: He will return in April for health maintenance exam and fasting labs including B12 level.  See ENT physician this afternoon and see ophthalmologist regarding right upper eyelid issue in the near future.

## 2019-01-26 NOTE — Patient Instructions (Signed)
Going to ENT today for hoarseness and throat clearing. Labs are normal. BP stable. Continue same meds and RTC 6 months

## 2019-02-18 ENCOUNTER — Ambulatory Visit: Payer: BC Managed Care – PPO | Admitting: Internal Medicine

## 2019-04-15 ENCOUNTER — Other Ambulatory Visit: Payer: Self-pay | Admitting: Internal Medicine

## 2019-07-27 ENCOUNTER — Other Ambulatory Visit: Payer: BC Managed Care – PPO | Admitting: Internal Medicine

## 2019-07-27 ENCOUNTER — Other Ambulatory Visit: Payer: Self-pay

## 2019-07-27 DIAGNOSIS — Z125 Encounter for screening for malignant neoplasm of prostate: Secondary | ICD-10-CM

## 2019-07-27 DIAGNOSIS — E538 Deficiency of other specified B group vitamins: Secondary | ICD-10-CM

## 2019-07-27 DIAGNOSIS — R413 Other amnesia: Secondary | ICD-10-CM

## 2019-07-27 DIAGNOSIS — E7800 Pure hypercholesterolemia, unspecified: Secondary | ICD-10-CM

## 2019-07-27 DIAGNOSIS — J45909 Unspecified asthma, uncomplicated: Secondary | ICD-10-CM

## 2019-07-27 DIAGNOSIS — I10 Essential (primary) hypertension: Secondary | ICD-10-CM

## 2019-07-27 DIAGNOSIS — Z0000 Encounter for general adult medical examination without abnormal findings: Secondary | ICD-10-CM

## 2019-07-27 LAB — COMPLETE METABOLIC PANEL WITH GFR
AG Ratio: 2 (calc) (ref 1.0–2.5)
ALT: 27 U/L (ref 9–46)
AST: 23 U/L (ref 10–35)
Albumin: 4.4 g/dL (ref 3.6–5.1)
Alkaline phosphatase (APISO): 37 U/L (ref 35–144)
BUN: 19 mg/dL (ref 7–25)
CO2: 31 mmol/L (ref 20–32)
Calcium: 9.3 mg/dL (ref 8.6–10.3)
Chloride: 101 mmol/L (ref 98–110)
Creat: 1.13 mg/dL (ref 0.70–1.25)
GFR, Est African American: 79 mL/min/{1.73_m2} (ref >=60)
GFR, Est Non African American: 68 mL/min/{1.73_m2} (ref >=60)
Globulin: 2.2 g/dL (calc) (ref 1.9–3.7)
Glucose, Bld: 92 mg/dL (ref 65–99)
Potassium: 3.7 mmol/L (ref 3.5–5.3)
Sodium: 139 mmol/L (ref 135–146)
Total Bilirubin: 1 mg/dL (ref 0.2–1.2)
Total Protein: 6.6 g/dL (ref 6.1–8.1)

## 2019-07-27 LAB — CBC WITH DIFFERENTIAL/PLATELET
Absolute Monocytes: 525 cells/uL (ref 200–950)
Basophils Absolute: 8 cells/uL (ref 0–200)
Basophils Relative: 0.2 %
Eosinophils Absolute: 90 cells/uL (ref 15–500)
Eosinophils Relative: 2.2 %
HCT: 42 % (ref 38.5–50.0)
Hemoglobin: 14.2 g/dL (ref 13.2–17.1)
Lymphs Abs: 1041 cells/uL (ref 850–3900)
MCH: 34.1 pg — ABNORMAL HIGH (ref 27.0–33.0)
MCHC: 33.8 g/dL (ref 32.0–36.0)
MCV: 101 fL — ABNORMAL HIGH (ref 80.0–100.0)
MPV: 10.5 fL (ref 7.5–12.5)
Monocytes Relative: 12.8 %
Neutro Abs: 2435 cells/uL (ref 1500–7800)
Neutrophils Relative %: 59.4 %
Platelets: 121 10*3/uL — ABNORMAL LOW (ref 140–400)
RBC: 4.16 10*6/uL — ABNORMAL LOW (ref 4.20–5.80)
RDW: 13.4 % (ref 11.0–15.0)
Total Lymphocyte: 25.4 %
WBC: 4.1 10*3/uL (ref 3.8–10.8)

## 2019-07-27 LAB — LIPID PANEL
Cholesterol: 148 mg/dL (ref <200)
HDL: 50 mg/dL (ref >=40)
LDL Cholesterol (Calc): 83 mg/dL (calc)
Non-HDL Cholesterol (Calc): 98 mg/dL (calc) (ref <130)
Total CHOL/HDL Ratio: 3 (calc) (ref <5.0)
Triglycerides: 69 mg/dL (ref <150)

## 2019-07-27 LAB — PSA: PSA: 1.6 ng/mL (ref <=4.0)

## 2019-07-29 ENCOUNTER — Encounter: Payer: Self-pay | Admitting: Internal Medicine

## 2019-07-29 ENCOUNTER — Other Ambulatory Visit: Payer: Self-pay

## 2019-07-29 ENCOUNTER — Ambulatory Visit (INDEPENDENT_AMBULATORY_CARE_PROVIDER_SITE_OTHER): Payer: BC Managed Care – PPO | Admitting: Internal Medicine

## 2019-07-29 VITALS — BP 92/70 | HR 63 | Temp 98.3°F | Ht 73.0 in | Wt 217.0 lb

## 2019-07-29 DIAGNOSIS — R490 Dysphonia: Secondary | ICD-10-CM

## 2019-07-29 DIAGNOSIS — E538 Deficiency of other specified B group vitamins: Secondary | ICD-10-CM | POA: Diagnosis not present

## 2019-07-29 DIAGNOSIS — J3089 Other allergic rhinitis: Secondary | ICD-10-CM

## 2019-07-29 DIAGNOSIS — Z0000 Encounter for general adult medical examination without abnormal findings: Secondary | ICD-10-CM

## 2019-07-29 DIAGNOSIS — I10 Essential (primary) hypertension: Secondary | ICD-10-CM

## 2019-07-29 DIAGNOSIS — Z8709 Personal history of other diseases of the respiratory system: Secondary | ICD-10-CM | POA: Diagnosis not present

## 2019-07-29 DIAGNOSIS — E7800 Pure hypercholesterolemia, unspecified: Secondary | ICD-10-CM

## 2019-07-29 DIAGNOSIS — R413 Other amnesia: Secondary | ICD-10-CM

## 2019-07-29 LAB — POCT URINALYSIS DIPSTICK
Appearance: NEGATIVE
Bilirubin, UA: NEGATIVE
Blood, UA: NEGATIVE
Glucose, UA: NEGATIVE
Ketones, UA: NEGATIVE
Leukocytes, UA: NEGATIVE
Nitrite, UA: NEGATIVE
Odor: NEGATIVE
Protein, UA: NEGATIVE
Spec Grav, UA: 1.01 (ref 1.010–1.025)
Urobilinogen, UA: 0.2 E.U./dL
pH, UA: 6.5 (ref 5.0–8.0)

## 2019-07-29 LAB — FOLATE: Folate: 14.4 ng/mL

## 2019-07-29 LAB — VITAMIN B12: Vitamin B-12: 464 pg/mL (ref 200–1100)

## 2019-07-29 MED ORDER — METHYLPREDNISOLONE ACETATE 80 MG/ML IJ SUSP
80.0000 mg | Freq: Once | INTRAMUSCULAR | Status: AC
  Administered 2019-07-29: 80 mg via INTRAMUSCULAR

## 2019-07-29 MED ORDER — ESOMEPRAZOLE MAGNESIUM 20 MG PO PACK: 20.0000 mg | PACK | Freq: Every day | ORAL | 1 refills | Status: DC

## 2019-07-29 NOTE — Patient Instructions (Signed)
Check B12 and folate levels. Continue B12 injections at home monthly. Add PPI to see if hoarseness improves. Depomedrol 80 mg IM given for hoarseness and allergic rhinitis/ asthma as pollen has been terrible this year. RTC in 6 months.

## 2019-07-29 NOTE — Progress Notes (Signed)
   Subjective:    Patient ID: Melvin Cushing, PhD, male    DOB: [birth year], 64 y.o.   MRN: [id number]  HPI 64 year old Male for health maintenance exam and  evaluation of medical issues.  History of hypertension, allergic rhinitis, asthma, B12 deficiency and insomnia.  History of mild memory loss.  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  Has been diagnosed with dysphonia.  Had voice therapy but it really did not help a lot.  This was at [facility].  We discussed this today.  We will try PPI and an injection of Depo-Medrol.  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  He gives himself B12 injections monthly.  Currently teaching 2 classes.  Uses microphone but says students complain that they can hear him well.  School will be out for couple of weeks.  He is looking forward to a [place] beach at [facility].  Will be celebrating 10-year wedding anniversary.    Review of Systems continues with hoarse voice- clears throat alot     Objective:   Physical Exam 92/70 pulse 63 T 98.3 pulse ox 97% weight 217 pounds BMI 28.63  Skin warm and dry.  Nodes none.  Has abrasion left external ear canal.  Have recommended antibiotic ointment.  If not improving needs to see dermatologist.  No thyromegaly.  No carotid bruits.  Pharynx is clear.  TMs are clear.  Neck is supple without JVD thyromegaly or adenopathy.  Chest is clear to auscultation without rales or wheezing.  Cardiac exam regular rate and rhythm normal S1 and S2 without murmurs or gallops.  Abdomen soft nondistended without hepatosplenomegaly masses or tenderness.  Prostate is normal without nodules.  No lower extremity edema.  Affect is within normal limits.       Assessment & Plan:  History of mild memory loss being monitored.  Currently not on medication for that.  History of B12 deficiency-continue monthly B12 injections at home.  Check B12 level.  Mild  macrocytosis-check B12 and folate levels.  He also takes folate.  History of asthma and allergic rhinitis treated with Singulair and Symbicort as well as Proventil and Flonase.  Depo-Medrol 80 mg IM given today.  Also takes Zyrtec.  Pollen has been terrible this year.  Dysphonia-does not want to go back to voice therapy.  See if Depo-Medrol helps.  Start Nexium 20 mg daily as a trial to see if it improves.  Essential hypertension-stable on losartan HCTZ  Hyperlipidemia treated with Crestor 5 mg daily and lipid panel is normal.  Plan: Follow-up in 6 months.

## 2019-08-14 ENCOUNTER — Other Ambulatory Visit: Payer: Self-pay | Admitting: Internal Medicine

## 2019-09-14 ENCOUNTER — Other Ambulatory Visit: Payer: Self-pay | Admitting: Internal Medicine

## 2019-09-21 ENCOUNTER — Other Ambulatory Visit: Payer: Self-pay

## 2019-09-21 ENCOUNTER — Ambulatory Visit (INDEPENDENT_AMBULATORY_CARE_PROVIDER_SITE_OTHER): Payer: BC Managed Care – PPO | Admitting: Internal Medicine

## 2019-09-21 ENCOUNTER — Telehealth: Payer: Self-pay

## 2019-09-21 ENCOUNTER — Encounter: Payer: Self-pay | Admitting: Internal Medicine

## 2019-09-21 DIAGNOSIS — K219 Gastro-esophageal reflux disease without esophagitis: Secondary | ICD-10-CM

## 2019-09-21 DIAGNOSIS — R490 Dysphonia: Secondary | ICD-10-CM

## 2019-09-21 MED ORDER — ESOMEPRAZOLE MAGNESIUM 40 MG PO CPDR: 40.0000 mg | DELAYED_RELEASE_CAPSULE | Freq: Every day | ORAL | 3 refills | Status: DC

## 2019-09-21 NOTE — Progress Notes (Signed)
   Subjective:    Patient ID: Melvin Cushing, PhD, male    DOB: September 14, 1955, 64 y.o.   MRN: [id number]  HPI 64 year old Male with history of dysphonia seen today by telephone consultation at patient request.  He is identified using 2 identifiers as Melvin Dixon, a longstanding patient in this practice.  He is agreeable to visit in this format today.  Patient is aware of some occasional heartburn.  He was here and April for physical examination.  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  Was diagnosed with dysphonia and had voice therapy [facility].  We agreed to try him on a PPI at that time and gave him an injection of Depo-Medrol.  He also has a history of allergic rhinitis and asthma.  Started him on Nexium 20 mg daily mid April 2021.  He feels that it has helped his voice.  By the end of the day it gets a little more hoarse but currently it sounds quite strong on the phone today.  Due to the voice becoming a bit hoarse in the late afternoon he indicates he might like to try a stronger dose of Nexium.    Review of Systems see above no new complaints history of hypertension and memory loss.  History of hyperlipidemia treated with Crestor.  History of B12 deficiency and gets himself monthly B12 injections at home.     Objective:   Physical Exam Reports that his blood pressure is stable. He has no hoarseness today on the telephone.  His voice sounds strong.      Assessment & Plan:  Dysphonia  GE reflux  History of allergic rhinitis and asthma  History of memory loss  History of B12 deficiency  Plan: We will increase Nexium to 40 mg daily and give this a try for several weeks.  Explained to him that this would be the maximum dose we can use.  He says he feels queasy taking Nexium in the mornings.  He eats a very light breakfast consisting of cereal and fruit.  Suggested he may want to try taking this medication at dinnertime.

## 2019-09-21 NOTE — Patient Instructions (Signed)
Increase Nexium to 40 mg daily.  Keep follow-up appointment Fall 2021.

## 2019-09-21 NOTE — Telephone Encounter (Signed)
Set up a time for a phone call or virtual visit

## 2019-09-21 NOTE — Telephone Encounter (Signed)
Scheduled at 12:30

## 2019-09-21 NOTE — Telephone Encounter (Signed)
Patient called needs to increase his Nexium to the next strength but he would like to speak with you briefly before changing his dose. Please advise.

## 2019-10-13 ENCOUNTER — Other Ambulatory Visit: Payer: Self-pay | Admitting: Internal Medicine

## 2019-10-25 ENCOUNTER — Other Ambulatory Visit: Payer: Self-pay

## 2019-10-25 ENCOUNTER — Encounter: Payer: Self-pay | Admitting: Internal Medicine

## 2019-10-25 ENCOUNTER — Ambulatory Visit: Payer: BC Managed Care – PPO | Admitting: Internal Medicine

## 2019-10-25 VITALS — BP 110/88 | HR 68 | Ht 73.0 in | Wt 217.0 lb

## 2019-10-25 DIAGNOSIS — R351 Nocturia: Secondary | ICD-10-CM

## 2019-10-25 DIAGNOSIS — E7800 Pure hypercholesterolemia, unspecified: Secondary | ICD-10-CM

## 2019-10-25 DIAGNOSIS — R39198 Other difficulties with micturition: Secondary | ICD-10-CM | POA: Diagnosis not present

## 2019-10-25 DIAGNOSIS — E538 Deficiency of other specified B group vitamins: Secondary | ICD-10-CM

## 2019-10-25 DIAGNOSIS — R413 Other amnesia: Secondary | ICD-10-CM | POA: Diagnosis not present

## 2019-10-25 DIAGNOSIS — I10 Essential (primary) hypertension: Secondary | ICD-10-CM

## 2019-10-25 DIAGNOSIS — Z8709 Personal history of other diseases of the respiratory system: Secondary | ICD-10-CM

## 2019-10-25 DIAGNOSIS — K219 Gastro-esophageal reflux disease without esophagitis: Secondary | ICD-10-CM

## 2019-10-25 DIAGNOSIS — R490 Dysphonia: Secondary | ICD-10-CM

## 2019-10-25 DIAGNOSIS — J3089 Other allergic rhinitis: Secondary | ICD-10-CM

## 2019-10-25 LAB — POCT URINALYSIS DIPSTICK
Appearance: NEGATIVE
Bilirubin, UA: NEGATIVE
Glucose, UA: NEGATIVE
Ketones, UA: NEGATIVE
Leukocytes, UA: NEGATIVE
Nitrite, UA: NEGATIVE
Odor: NEGATIVE
Protein, UA: NEGATIVE
Spec Grav, UA: 1.015 (ref 1.010–1.025)
Urobilinogen, UA: 0.2 E.U./dL
pH, UA: 6 (ref 5.0–8.0)

## 2019-10-25 MED ORDER — TAMSULOSIN HCL 0.4 MG PO CAPS: 0.4000 mg | ORAL_CAPSULE | Freq: Every day | ORAL | 3 refills | Status: DC

## 2019-10-25 NOTE — Progress Notes (Signed)
° °  Subjective:    Patient ID: Melvin Cushing, PhD, male    DOB: [birth year], 64 y.o.   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  He also is concerned he may have Peyronie's disease.  History of mild memory loss and hypertension.  History of B12 deficiency treated with monthly B12 injections.  History of asthma.  History of GE reflux/dysphonia treated with PPI.  He is on losartan HCTZ, Singulair, Crestor 5 mg daily, Symbicort, Nexium, IM B12 monthly, Zyrtec albuterol inhaler.  We discussed starting Flomax and he is agreeable to try this for BPH/nocturia.  I have suggested urology consultation to discern if he indeed has Peyronie's disease.    Review of Systems see above-sometimes has to get up to urinate several times a night.     Objective:   Physical Exam  He appears to have a right hydrocele.  I cannot definitely tell that he has Peyronie's disease.  He says he feels an abnormality at the proximal shaft of his penis.  Prostate was not examined today but was examined in April and was normal without nodules.  PSA at that time was 1.6.      Assessment & Plan:  BPH/nocturia  ?  Peyronie's disease  Right hydrocele  Plan: Patient will be referred to [facility] urology for evaluation.  I'm starting him on Flomax 0.4 mg daily.

## 2019-10-28 NOTE — Patient Instructions (Signed)
Begin Flomax 0.4 mg at bedtime.  [facility]erral to [facility] to check hydrocele and check for Peyronie's disease.

## 2019-11-25 ENCOUNTER — Telehealth: Payer: Self-pay

## 2019-11-25 NOTE — Telephone Encounter (Signed)
Called and spoke with patient, he did not schedule appointment in October, it was with one of the eye doctors in [place]. The eye specialists he normal sees is quite a distance away and he does not want to go there. So he is going to call his insurance and see who in [place] is on his approved list, and then see if he can get in with them. He also stated that he has been seen at [facility] in the past, so he thought he might try there first. He will call back if we can assist.

## 2019-11-25 NOTE — Telephone Encounter (Signed)
Can you please see who ophthalmologist is and call them for a sooner appt.

## 2019-11-25 NOTE — Telephone Encounter (Signed)
Patient called has left eye problem (out of focus). He has called an ophthalmologist but have scheduled him in October, he would like to speak with you and get some recommendations. He can he can do a virtual visit.  Call back number [phone]

## 2019-11-26 NOTE — Telephone Encounter (Signed)
Sharee Pimple called back this morning to say that patient has an appointment today at [facility].

## 2020-01-11 ENCOUNTER — Other Ambulatory Visit: Payer: Self-pay | Admitting: Internal Medicine

## 2020-01-25 ENCOUNTER — Other Ambulatory Visit: Payer: Self-pay

## 2020-01-25 ENCOUNTER — Other Ambulatory Visit: Payer: BC Managed Care – PPO | Admitting: Internal Medicine

## 2020-01-25 DIAGNOSIS — E538 Deficiency of other specified B group vitamins: Secondary | ICD-10-CM

## 2020-01-25 DIAGNOSIS — R413 Other amnesia: Secondary | ICD-10-CM

## 2020-01-25 LAB — FOLATE: Folate: 10.4 ng/mL

## 2020-01-25 LAB — VITAMIN B12: Vitamin B-12: 539 pg/mL (ref 200–1100)

## 2020-01-27 ENCOUNTER — Ambulatory Visit: Payer: BC Managed Care – PPO | Admitting: Internal Medicine

## 2020-01-27 ENCOUNTER — Other Ambulatory Visit: Payer: Self-pay

## 2020-01-27 ENCOUNTER — Encounter: Payer: Self-pay | Admitting: Internal Medicine

## 2020-01-27 VITALS — BP 90/60 | HR 64 | Ht 73.0 in | Wt 209.0 lb

## 2020-01-27 DIAGNOSIS — J3089 Other allergic rhinitis: Secondary | ICD-10-CM

## 2020-01-27 DIAGNOSIS — Z8709 Personal history of other diseases of the respiratory system: Secondary | ICD-10-CM

## 2020-01-27 DIAGNOSIS — E7800 Pure hypercholesterolemia, unspecified: Secondary | ICD-10-CM | POA: Diagnosis not present

## 2020-01-27 DIAGNOSIS — N401 Enlarged prostate with lower urinary tract symptoms: Secondary | ICD-10-CM

## 2020-01-27 DIAGNOSIS — I10 Essential (primary) hypertension: Secondary | ICD-10-CM | POA: Diagnosis not present

## 2020-01-27 DIAGNOSIS — E538 Deficiency of other specified B group vitamins: Secondary | ICD-10-CM

## 2020-01-27 DIAGNOSIS — R413 Other amnesia: Secondary | ICD-10-CM | POA: Diagnosis not present

## 2020-01-27 DIAGNOSIS — R351 Nocturia: Secondary | ICD-10-CM

## 2020-01-27 DIAGNOSIS — R490 Dysphonia: Secondary | ICD-10-CM

## 2020-01-27 DIAGNOSIS — K219 Gastro-esophageal reflux disease without esophagitis: Secondary | ICD-10-CM

## 2020-01-27 NOTE — Progress Notes (Signed)
   Subjective:    Patient ID: Melvin Cushing, PhD, male    DOB: 01/02/1956, 64 y.o.   MRN: [id number]  HPI  64 year old Male for 6 month recheck. Has seen Urology for nocturia and decreased urine stream and is now on Proscar for BPH.  History of mild memory loss and hypertension.  History of B12 deficiency treated with monthly B12 injections.  History of asthma.  History of GE reflux/dysphonia treated with PPI.  In addition to Proscar he is also on Flomax.  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  B12 and folate levels were checked today and are within normal limits.    Review of Systems no new complaints     Objective:   Physical Exam Blood pressure 110/70, pulse 64 pulse oximetry 97% Neck is supple without JVD thyromegaly or carotid bruits.  Chest clear to auscultation.  Cardiac exam regular rate and rhythm normal S1 and S2 without murmurs or gallops.  No lower extremity pitting edema.  His affect is within normal limits.  Memory not tested today.      Assessment & Plan:  History of mild memory loss  History of B12 deficiency-stable with B12 injections monthly per patient at home  Essential hypertension stable on current regimen of losartan HCTZ 100/25 daily  GE reflux treated with Nexium.  This also is a treatment for his dysphonia.  History of allergic rhinitis and asthma treated with Proventil inhaler Zyrtec Singulair and Flonase nasal spray as well as Symbicort.  BPH treated with Flomax and Proscar  Pure hypercholesterolemia treated with statin medication and stable.  Lipid panel will be checked with next visit  Plan: Continue these medications and follow-up with health maintenance exam and Medicare wellness visit in 6 months.  He has had 3 COVID-19 injections.  Had flu vaccine September 30.

## 2020-02-02 DIAGNOSIS — N400 Enlarged prostate without lower urinary tract symptoms: Secondary | ICD-10-CM | POA: Insufficient documentation

## 2020-02-02 NOTE — Patient Instructions (Signed)
It was a pleasure to see you today.  Your labs are within normal limits and your blood pressure is under good control.  Continue current medications and follow-up with health maintenance exam and Medicare wellness visit in 6 months.  Immunizations are up-to-date.

## 2020-03-30 ENCOUNTER — Other Ambulatory Visit: Payer: Self-pay

## 2020-03-30 MED ORDER — FLUTICASONE PROPIONATE 50 MCG/ACT NA SUSP: 2.0000 | Freq: Two times a day (BID) | NASAL | 99 refills | Status: DC

## 2020-04-06 ENCOUNTER — Other Ambulatory Visit: Payer: Self-pay | Admitting: Internal Medicine

## 2020-06-10 ENCOUNTER — Other Ambulatory Visit: Payer: Self-pay | Admitting: Internal Medicine

## 2020-07-27 ENCOUNTER — Other Ambulatory Visit: Payer: BC Managed Care – PPO | Admitting: Internal Medicine

## 2020-07-27 ENCOUNTER — Other Ambulatory Visit: Payer: Self-pay

## 2020-07-27 DIAGNOSIS — E538 Deficiency of other specified B group vitamins: Secondary | ICD-10-CM

## 2020-07-27 DIAGNOSIS — R718 Other abnormality of red blood cells: Secondary | ICD-10-CM

## 2020-07-27 DIAGNOSIS — E7800 Pure hypercholesterolemia, unspecified: Secondary | ICD-10-CM

## 2020-07-27 DIAGNOSIS — K219 Gastro-esophageal reflux disease without esophagitis: Secondary | ICD-10-CM

## 2020-07-27 DIAGNOSIS — I10 Essential (primary) hypertension: Secondary | ICD-10-CM

## 2020-07-27 NOTE — Addendum Note (Signed)
Addended by: Mady Haagensen on: 07/27/2020 03:34 PM   Modules accepted: Orders

## 2020-07-28 LAB — CBC WITH DIFFERENTIAL/PLATELET
Absolute Monocytes: 672 {cells}/uL (ref 200–950)
Basophils Absolute: 19 {cells}/uL (ref 0–200)
Basophils Relative: 0.4 %
Eosinophils Absolute: 99 {cells}/uL (ref 15–500)
Eosinophils Relative: 2.1 %
HCT: 42.1 % (ref 38.5–50.0)
Hemoglobin: 14.6 g/dL (ref 13.2–17.1)
Lymphs Abs: 1231 {cells}/uL (ref 850–3900)
MCH: 35.1 pg — ABNORMAL HIGH (ref 27.0–33.0)
MCHC: 34.7 g/dL (ref 32.0–36.0)
MCV: 101.2 fL — ABNORMAL HIGH (ref 80.0–100.0)
MPV: 10.7 fL (ref 7.5–12.5)
Monocytes Relative: 14.3 %
Neutro Abs: 2679 {cells}/uL (ref 1500–7800)
Neutrophils Relative %: 57 %
Platelets: 112 Thousand/uL — ABNORMAL LOW (ref 140–400)
RBC: 4.16 Million/uL — ABNORMAL LOW (ref 4.20–5.80)
RDW: 13.4 % (ref 11.0–15.0)
Total Lymphocyte: 26.2 %
WBC: 4.7 Thousand/uL (ref 3.8–10.8)

## 2020-07-28 LAB — COMPLETE METABOLIC PANEL WITH GFR
AG Ratio: 2.4 (calc) (ref 1.0–2.5)
ALT: 29 U/L (ref 9–46)
AST: 20 U/L (ref 10–35)
Albumin: 4.7 g/dL (ref 3.6–5.1)
Alkaline phosphatase (APISO): 45 U/L (ref 35–144)
BUN: 20 mg/dL (ref 7–25)
CO2: 31 mmol/L (ref 20–32)
Calcium: 9.6 mg/dL (ref 8.6–10.3)
Chloride: 100 mmol/L (ref 98–110)
Creat: 1.25 mg/dL (ref 0.70–1.25)
GFR, Est African American: 70 mL/min/{1.73_m2} (ref >=60)
GFR, Est Non African American: 60 mL/min/{1.73_m2} (ref >=60)
Globulin: 2 g/dL (calc) (ref 1.9–3.7)
Glucose, Bld: 102 mg/dL — ABNORMAL HIGH (ref 65–99)
Potassium: 4.1 mmol/L (ref 3.5–5.3)
Sodium: 139 mmol/L (ref 135–146)
Total Bilirubin: 1.1 mg/dL (ref 0.2–1.2)
Total Protein: 6.7 g/dL (ref 6.1–8.1)

## 2020-07-28 LAB — TEST AUTHORIZATION

## 2020-07-28 LAB — PSA: PSA: 0.85 ng/mL

## 2020-07-28 LAB — LIPID PANEL
Cholesterol: 166 mg/dL (ref <200)
HDL: 57 mg/dL (ref >=40)
LDL Cholesterol (Calc): 91 mg/dL (calc)
Non-HDL Cholesterol (Calc): 109 mg/dL (calc) (ref <130)
Total CHOL/HDL Ratio: 2.9 (calc) (ref <5.0)
Triglycerides: 88 mg/dL (ref <150)

## 2020-07-28 LAB — VITAMIN B12: Vitamin B-12: 455 pg/mL (ref 200–1100)

## 2020-08-01 ENCOUNTER — Other Ambulatory Visit: Payer: BC Managed Care – PPO | Admitting: Internal Medicine

## 2020-08-03 ENCOUNTER — Ambulatory Visit (INDEPENDENT_AMBULATORY_CARE_PROVIDER_SITE_OTHER): Payer: BC Managed Care – PPO | Admitting: Internal Medicine

## 2020-08-03 ENCOUNTER — Other Ambulatory Visit: Payer: Self-pay

## 2020-08-03 ENCOUNTER — Encounter: Payer: Self-pay | Admitting: Internal Medicine

## 2020-08-03 VITALS — BP 110/70 | HR 66 | Ht 73.0 in | Wt 213.0 lb

## 2020-08-03 DIAGNOSIS — E538 Deficiency of other specified B group vitamins: Secondary | ICD-10-CM | POA: Diagnosis not present

## 2020-08-03 DIAGNOSIS — R490 Dysphonia: Secondary | ICD-10-CM

## 2020-08-03 DIAGNOSIS — R42 Dizziness and giddiness: Secondary | ICD-10-CM

## 2020-08-03 DIAGNOSIS — R351 Nocturia: Secondary | ICD-10-CM

## 2020-08-03 DIAGNOSIS — I10 Essential (primary) hypertension: Secondary | ICD-10-CM

## 2020-08-03 DIAGNOSIS — K219 Gastro-esophageal reflux disease without esophagitis: Secondary | ICD-10-CM

## 2020-08-03 DIAGNOSIS — Z0000 Encounter for general adult medical examination without abnormal findings: Secondary | ICD-10-CM | POA: Diagnosis not present

## 2020-08-03 DIAGNOSIS — E7800 Pure hypercholesterolemia, unspecified: Secondary | ICD-10-CM

## 2020-08-03 DIAGNOSIS — R413 Other amnesia: Secondary | ICD-10-CM | POA: Diagnosis not present

## 2020-08-03 DIAGNOSIS — N486 Induration penis plastica: Secondary | ICD-10-CM

## 2020-08-03 DIAGNOSIS — N401 Enlarged prostate with lower urinary tract symptoms: Secondary | ICD-10-CM

## 2020-08-03 DIAGNOSIS — Z8709 Personal history of other diseases of the respiratory system: Secondary | ICD-10-CM

## 2020-08-03 DIAGNOSIS — J301 Allergic rhinitis due to pollen: Secondary | ICD-10-CM

## 2020-08-03 LAB — POCT URINALYSIS DIPSTICK
Appearance: NEGATIVE
Bilirubin, UA: NEGATIVE
Blood, UA: NEGATIVE
Glucose, UA: NEGATIVE
Ketones, UA: NEGATIVE
Leukocytes, UA: NEGATIVE
Nitrite, UA: NEGATIVE
Odor: NEGATIVE
Protein, UA: NEGATIVE
Spec Grav, UA: 1.01 (ref 1.010–1.025)
Urobilinogen, UA: 0.2 E.U./dL
pH, UA: 6.5 (ref 5.0–8.0)

## 2020-08-03 MED ORDER — LOSARTAN POTASSIUM 100 MG PO TABS: 100.0000 mg | ORAL_TABLET | Freq: Every day | ORAL | 0 refills | Status: DC

## 2020-08-03 NOTE — Progress Notes (Signed)
Subjective:    Patient ID: Melvin Cushing, PhD, male    DOB: [birth year], 65 y.o.   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  He has had 3 COVID-19 immunizations.  Had flu vaccine in the Fall 2021.  Sometimes feels dizzy at times playing golf and noticed that his blood pressure is around 102/70 with pulse ranging between 62 and 76.  Seems to deal with that.  Advised him to stay well-hydrated and call if symptoms persists.  He is on losartan for hypertension 100 mg daily.  He does take Flomax for BPH.  He also is on Proscar 5 mg daily.  I do not see any other medications that would make him dizzy.  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  Had upper eyelid blepharoplasty with excess skin excision both upper eyelids at [facility].  He has a history of B12 and gives himself monthly B12 injections.  History of hypertension, allergic rhinitis, asthma.  History of insomnia.  History of mild memory loss.  Is not on any medication for memory loss.  Has been diagnosed with dysphonia and has a hoarse voice.  He had voice therapy but it did not really help much.  This was through [facility].  He does take Nexium 40 mg daily.  History of asthma treated with Symbicort.  Social history: He is married.  Wife formerly worked for [employer] but now has her own business and works from home.  Patient commutes from [place] to work at [employer].  He has 2 daughters from a previous marriage.  Non-smoker.  Social alcohol consumption but not very often.  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  Had [place] procedure of left in October 2021.  Received immunotherapy for allergic rhinitis prior to the pandemic but has not done so recently.  Was also seen by Dr. Lake Bells and nurse  practitioner regarding mild persistent asthma in 2018.  History of Peyronie's disease just being observed at the present time by [facility].  Also treated with Flomax and Proscar for BPH.  Also in 2018 he had an acute attack of severe vertigo.  He was seen in the Emergency department.  He had a negative CT of the brain.  EKG showed possible intraventricular conduction delay.  His potassium was slightly low at 3.3.  We compared EKG with EKG in his old paper chart from 2009 and there were similar findings.  He was given potassium supplement and has not had recurrence.  Has done well during the pandemic and has not contracted acute COVID-19 while teaching.  Has been seen by [facility] for memory loss in the remote past.  Was tried on Zoloft there by Dr. Werner Lean for possible depression without much improvement.  Later he found out that his insurance did not cover visits there.  Past medical history: Right lower lobe pneumonia December 1994.  Prostatitis March 2000, vasectomy December 1993.  History of pure hypercholesterolemia treated with Crestor.  Lipid panel is normal.  In May 2013 Dr. Sherrye Payor removed basal cell carcinoma from right sideburn area.  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  Family history: History of dementia in his mother.  Father died at age 93 after several heart attacks.  1  brother in good health.  6 sisters living.  Both parents had diabetes.  Father had hypertension.  Some siblings with seizure disorder.  1 sister died with lung cancer with history of smoking.         Review of Systems see above     Objective:   Physical Exam Blood pressure excellent at 110/70 pulse 66 regular pulse oximetry 97% weight 213 pounds BMI 28.10  Skin: Warm and dry.  Nodes none.  TMs clear.  Neck is supple without JVD thyromegaly or carotid bruits.  Chest clear to auscultation.  Cardiac exam regular rate and rhythm normal S1 and S2  without murmurs or gallops.  Abdomen soft nondistended without hepatosplenomegaly masses or tenderness.  Genitourinary exam deferred to urologist.  No lower extremity pitting edema.  Affect thought and judgment appear to be normal.  No gross focal deficits on brief neurological exam.       Assessment & Plan:  Essential hypertension-stable on current regimen.  While playing golf he needs to stay well-hydrated and let me know if he continues to have episodes of dizziness while playing golf.  We can discontinue his HCTZ and just give him losartan 100 mg daily.  He will return in 6 weeks and have a basic metabolic panel drawn a couple of days before his office visit in 6 weeks.  This also could be positional type vertigo.  History of Peyronie's disease seen by urologist and being monitored  History of BPH treated with Proscar and Flomax  History of mild memory loss-has not worsened during the pandemic and he continues to work full-time  History of B12 deficiency-B12 level is normal and he gives himself IM B12 monthly  History of asthma and allergic rhinitis.  Has been seen by allergist but currently has not been taking allergy injections of the past 2 or 3 years during the pandemic.  History of dysphonia-seems to have improved and he does not want to return to voice therapy  Pure hypercholesterolemia treated with Crestor 5 mg daily and stable  Family history of heart disease in Father  Family history of memory loss in Mother  GE reflux treated with PPI  Plan: He will continue with current medications.  His B12 level is normal and he will continue with IM B12 monthly.  Lipid panel is normal on Crestor.  PSA is normal.  He has had some vertigo when playing golf, and I think that  may be positional versus state of hydration.  Continue to monitor.  Continue follow-up with urology for BPH and Peyronie's.  Recommend COVID booster.  Fasting glucose was 102.  Continue to monitor.  Mild glucose  intolerance treated with diet.  Continue Urology follow-up.

## 2020-08-03 NOTE — Patient Instructions (Addendum)
Change Losartan/ HCTZ  To just Losartan 100 mg daily. RTC  In 6 weeks. Have B-met a couple of days before OV in 6 weeks.  Continue to give yourself B12 injections monthly.  Continue current medications.  It was a pleasure to see you today.

## 2020-08-16 ENCOUNTER — Other Ambulatory Visit: Payer: Self-pay | Admitting: Internal Medicine

## 2020-08-18 ENCOUNTER — Telehealth: Payer: Self-pay | Admitting: Internal Medicine

## 2020-08-18 ENCOUNTER — Ambulatory Visit: Payer: BC Managed Care – PPO | Admitting: Internal Medicine

## 2020-08-18 ENCOUNTER — Encounter: Payer: Self-pay | Admitting: Internal Medicine

## 2020-08-18 ENCOUNTER — Other Ambulatory Visit: Payer: Self-pay

## 2020-08-18 ENCOUNTER — Telehealth (INDEPENDENT_AMBULATORY_CARE_PROVIDER_SITE_OTHER): Payer: BC Managed Care – PPO | Admitting: Internal Medicine

## 2020-08-18 VITALS — BP 141/85 | Temp 99.5°F

## 2020-08-18 DIAGNOSIS — U071 COVID-19: Secondary | ICD-10-CM

## 2020-08-18 MED ORDER — AZITHROMYCIN 250 MG PO TABS: ORAL_TABLET | ORAL | 0 refills | Status: AC

## 2020-08-18 MED ORDER — PREDNISONE 10 MG PO TABS: ORAL_TABLET | ORAL | 0 refills | Status: DC

## 2020-08-18 MED ORDER — BENZONATATE 100 MG PO CAPS: 100.0000 mg | ORAL_CAPSULE | Freq: Three times a day (TID) | ORAL | 0 refills | Status: DC | PRN

## 2020-08-18 NOTE — Telephone Encounter (Signed)
He does not really qualify for Paxlovid. Not overweight. Asthma is mild. Does not have diabetes. We can do virtual visit.

## 2020-08-18 NOTE — Telephone Encounter (Signed)
PT aware. Scheduled video visit.

## 2020-08-18 NOTE — Telephone Encounter (Signed)
Dr [name] [phone]  Dr Romilda Garret called to say he has sore throat, cough and fever 99.5, done a home COVID test and it is positive. He would like to virtual visit and was also wandering if would qualify for the new medication for COVID.

## 2020-08-21 ENCOUNTER — Telehealth: Payer: Self-pay

## 2020-08-21 NOTE — Telephone Encounter (Signed)
Ok- he sounds stable. Thanks

## 2020-08-21 NOTE — Telephone Encounter (Signed)
Called to check on patient, 3rd day on a row with no fever, has a productive cough, BP down to normal, hr normal, O2 95-96. Staying home and isolating. No chest pain the tessalon pearls are working.

## 2020-08-23 ENCOUNTER — Telehealth: Payer: Self-pay | Admitting: Internal Medicine

## 2020-08-23 NOTE — Progress Notes (Signed)
   Subjective:    Patient ID: Melvin Cushing, PhD, male    DOB: Aug 31, 1955, 65 y.o.   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  Patient is identified using 2 identifiers as Melvin Dixon, a longstanding patient in this practice.  He is agreeable to visit in this format today.  He is at his home and I am at my office.  Patient called to say that he had sore throat, cough temperature 99.5 degrees.  He did a home COVID test and it was positive.  He is wondering if he would qualify for Paxlovid.  His symptoms are relatively mild and I would prefer not to prescribe that for him.  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  Reports that his pulse oximetry is 92% on room air and that his temperature is 99.5 degrees.  Reports that his blood pressure is 141/85.  Medical issues including hypertension, history of asthma, mild memory loss and B12 deficiency  Review of Systems denies nausea, vomiting, shaking chills.  Does have malaise and fatigue.     Objective:   Physical Exam He is seen virtually and does not appear to be tachypneic.  No audible wheezing.  He looks a bit pale and slightly fatigued.       Assessment & Plan:  Acute COVID-19 virus infection  Plan: I would like for him to take prednisone 10 mg tablets in tapering course starting with 6 tablets day 1 and decreasing by 1 tablet daily i.e. 6-5-4-3-2-1 taper.  This medication has been E prescribed to [facility] in [place].  Electronically prescribed Zithromax Z-PAK 2 tabs day 1 followed by 1 tab days 2 through 5 and Tessalon Perles 100 mg up to 3 times daily if needed for cough.  He will monitor his pulse oximetry at home.  Advised him to stay well-hydrated.  May take Tylenol for fever.  Advised him to walk some to prevent atelectasis.  He has an inhaler albuterol on hand to use 2 sprays 4 times  daily.  Also have refilled Symbicort inhaler 160/4.5 call if symptoms worsen.

## 2020-08-23 NOTE — Telephone Encounter (Addendum)
[place] results to [place] [phone] along with demographics.  Positive as of 08/18/2020

## 2020-09-07 ENCOUNTER — Other Ambulatory Visit: Payer: Self-pay

## 2020-09-07 ENCOUNTER — Other Ambulatory Visit: Payer: BC Managed Care – PPO | Admitting: Internal Medicine

## 2020-09-07 DIAGNOSIS — U071 COVID-19: Secondary | ICD-10-CM

## 2020-09-07 DIAGNOSIS — Z5181 Encounter for therapeutic drug level monitoring: Secondary | ICD-10-CM

## 2020-09-07 LAB — BASIC METABOLIC PANEL
BUN: 14 mg/dL (ref 7–25)
CO2: 29 mmol/L (ref 20–32)
Calcium: 9.4 mg/dL (ref 8.6–10.3)
Chloride: 103 mmol/L (ref 98–110)
Creat: 1.14 mg/dL (ref 0.70–1.25)
Glucose, Bld: 87 mg/dL (ref 65–99)
Potassium: 4.4 mmol/L (ref 3.5–5.3)
Sodium: 139 mmol/L (ref 135–146)

## 2020-09-11 NOTE — Patient Instructions (Addendum)
Take prednisone in tapering course as directed starting with 6 tablets day 1 and decreasing by 10 mg daily i.e. 6-5-4-3-2-1 taper.  Take Zithromax Z-PAK 2 tabs day 1 followed by 1 tab days 2 through 5.  Take Tessalon Perles up to 3 times daily if needed for cough.  Monitor pulse oximetry at home.  Use albuterol and Symbicort inhalers for at least a week.  Call if symptoms worsen.  Stay well-hydrated and walk some to keep lungs open.

## 2020-09-14 ENCOUNTER — Ambulatory Visit: Payer: BC Managed Care – PPO | Admitting: Internal Medicine

## 2020-09-14 ENCOUNTER — Other Ambulatory Visit: Payer: Self-pay

## 2020-09-14 ENCOUNTER — Encounter: Payer: Self-pay | Admitting: Internal Medicine

## 2020-09-14 VITALS — BP 120/70 | HR 65 | Ht 73.0 in | Wt 212.0 lb

## 2020-09-14 DIAGNOSIS — R413 Other amnesia: Secondary | ICD-10-CM | POA: Diagnosis not present

## 2020-09-14 DIAGNOSIS — N401 Enlarged prostate with lower urinary tract symptoms: Secondary | ICD-10-CM

## 2020-09-14 DIAGNOSIS — E538 Deficiency of other specified B group vitamins: Secondary | ICD-10-CM

## 2020-09-14 DIAGNOSIS — J301 Allergic rhinitis due to pollen: Secondary | ICD-10-CM

## 2020-09-14 DIAGNOSIS — K219 Gastro-esophageal reflux disease without esophagitis: Secondary | ICD-10-CM

## 2020-09-14 DIAGNOSIS — E785 Hyperlipidemia, unspecified: Secondary | ICD-10-CM

## 2020-09-14 DIAGNOSIS — I10 Essential (primary) hypertension: Secondary | ICD-10-CM

## 2020-09-14 DIAGNOSIS — N486 Induration penis plastica: Secondary | ICD-10-CM

## 2020-09-14 DIAGNOSIS — R490 Dysphonia: Secondary | ICD-10-CM

## 2020-09-14 DIAGNOSIS — Z8616 Personal history of COVID-19: Secondary | ICD-10-CM | POA: Diagnosis not present

## 2020-09-14 DIAGNOSIS — J3089 Other allergic rhinitis: Secondary | ICD-10-CM

## 2020-09-14 DIAGNOSIS — Z8709 Personal history of other diseases of the respiratory system: Secondary | ICD-10-CM

## 2020-09-14 DIAGNOSIS — E7800 Pure hypercholesterolemia, unspecified: Secondary | ICD-10-CM

## 2020-09-14 DIAGNOSIS — R351 Nocturia: Secondary | ICD-10-CM

## 2020-09-14 MED ORDER — PREDNISONE 10 MG PO TABS: ORAL_TABLET | ORAL | 0 refills | Status: DC

## 2020-09-14 MED ORDER — ALBUTEROL SULFATE HFA 108 (90 BASE) MCG/ACT IN AERS: 2.0000 | INHALATION_SPRAY | Freq: Four times a day (QID) | RESPIRATORY_TRACT | 3 refills | Status: DC | PRN

## 2020-09-14 NOTE — Progress Notes (Signed)
   Subjective:    Patient ID: Melvin Cushing, PhD, male    DOB: [birth year], 65 y.o.   MRN: [id number]  HPI 65 year old Male seen for 6-week follow-up visit made at the time of his health maintenance exam on April 21.  At the time he was having some episodes of dizziness while playing golf.  We talked about options such as discontinuing HCTZ and just treating hypertension with losartan.  I was wondering if he had positional vertigo but that is why he is here today.  He also has a history of mild memory loss and continues to work full-time.  History of B12 deficiency.  History of Peyronie's disease seen by urologist as well as BPH treated with Proscar and Flomax.  History of asthma and allergic rhinitis.  History of dysphonia which has improved.  In the interim he developed COVID-19 and called on May 6 to report this.  He had a virtual visit at that time.  He had a mild case and it was felt that he could be treated conservatively at home.  He was given prednisone and a tapering course starting with 60 mg day 1 and decreasing by 10 mg daily i.e. 6-5-4-3-2-1 taper.  He was treated with Zithromax and Tessalon Perles.  He was told to use albuterol and Symbicort inhalers for at least a week.  He reported that he had significant fatigue with the illness but recovered and has been doing well.    Review of Systems when stopped diuretic after last visit, he had mild dependent edema.  Told him he could restart diuretic but was concerned previously about dizziness when playing golf.  He seems to think that the dizziness has abated.     Objective:   Physical Exam   BP 120/80 pulse 65 Pulse 97% neck is supple without JVD thyromegaly or carotid bruits.  Chest is clear to auscultation without rales or wheezing.  Cardiac exam regular rate and rhythm normal S1 and S2.  No lower extremity pitting edema.      Assessment & Plan:  Recent COVID-19 virus infection treated at home and recovered  History of mild memory  loss  History of B12 deficiency treated with monthly B12 injections by patient at home  Essential hypertension treated with losartan and stable  History of asthma treated with Symbicort and albuterol  Mild dependent edema  Hyperlipidemia treated with Crestor and stable  History of allergic rhinitis treated with Singulair Flonase and Zyrtec  BPH treated with Flomax and Proscar  History of Peyronie's disease  Mild hyperlipidemia treated with Crestor 5 mg daily and stable  History of GE reflux treated with Nexium  Basic metabolic panel drawn in May 26 in anticipation of this office visit is normal  Plan: He will return in October for 5-month follow-up visit.  Another prednisone taper has been provided should he need it during the summer months for asthma.

## 2020-10-08 NOTE — Patient Instructions (Addendum)
It was a pleasure to see you today.  Basic metabolic panel is stable.  You are now off of diuretic and just taking losartan for hypertension.  Call if edema worsens during the summer months.  Otherwise we will plan to see you in October.  Recommend fourth COVID booster in 2 months.  Another prednisone taper has been provided should he need it during the summer months.

## 2020-10-13 ENCOUNTER — Other Ambulatory Visit: Payer: Self-pay | Admitting: Internal Medicine

## 2021-01-25 ENCOUNTER — Other Ambulatory Visit: Payer: BC Managed Care – PPO | Admitting: Internal Medicine

## 2021-01-25 ENCOUNTER — Other Ambulatory Visit: Payer: Self-pay

## 2021-01-25 DIAGNOSIS — E7800 Pure hypercholesterolemia, unspecified: Secondary | ICD-10-CM

## 2021-01-25 DIAGNOSIS — I10 Essential (primary) hypertension: Secondary | ICD-10-CM

## 2021-01-26 LAB — COMPLETE METABOLIC PANEL WITH GFR
AG Ratio: 2 (calc) (ref 1.0–2.5)
ALT: 26 U/L (ref 9–46)
AST: 21 U/L (ref 10–35)
Albumin: 4.3 g/dL (ref 3.6–5.1)
Alkaline phosphatase (APISO): 51 U/L (ref 35–144)
BUN: 13 mg/dL (ref 7–25)
CO2: 31 mmol/L (ref 20–32)
Calcium: 9.3 mg/dL (ref 8.6–10.3)
Chloride: 101 mmol/L (ref 98–110)
Creat: 1.13 mg/dL (ref 0.70–1.35)
Globulin: 2.2 g/dL (calc) (ref 1.9–3.7)
Glucose, Bld: 91 mg/dL (ref 65–99)
Potassium: 4.3 mmol/L (ref 3.5–5.3)
Sodium: 139 mmol/L (ref 135–146)
Total Bilirubin: 0.9 mg/dL (ref 0.2–1.2)
Total Protein: 6.5 g/dL (ref 6.1–8.1)
eGFR: 72 mL/min/{1.73_m2} (ref >=60)

## 2021-01-26 LAB — CBC WITH DIFFERENTIAL/PLATELET
Absolute Monocytes: 504 cells/uL (ref 200–950)
Basophils Absolute: 21 cells/uL (ref 0–200)
Basophils Relative: 0.5 %
Eosinophils Absolute: 119 cells/uL (ref 15–500)
Eosinophils Relative: 2.9 %
HCT: 40.6 % (ref 38.5–50.0)
Hemoglobin: 14 g/dL (ref 13.2–17.1)
Lymphs Abs: 1144 cells/uL (ref 850–3900)
MCH: 34.7 pg — ABNORMAL HIGH (ref 27.0–33.0)
MCHC: 34.5 g/dL (ref 32.0–36.0)
MCV: 100.5 fL — ABNORMAL HIGH (ref 80.0–100.0)
MPV: 10.3 fL (ref 7.5–12.5)
Monocytes Relative: 12.3 %
Neutro Abs: 2312 cells/uL (ref 1500–7800)
Neutrophils Relative %: 56.4 %
Platelets: 123 10*3/uL — ABNORMAL LOW (ref 140–400)
RBC: 4.04 10*6/uL — ABNORMAL LOW (ref 4.20–5.80)
RDW: 13.1 % (ref 11.0–15.0)
Total Lymphocyte: 27.9 %
WBC: 4.1 10*3/uL (ref 3.8–10.8)

## 2021-01-26 LAB — LIPID PANEL
Cholesterol: 171 mg/dL (ref <200)
HDL: 61 mg/dL (ref >=40)
LDL Cholesterol (Calc): 91 mg/dL (calc)
Non-HDL Cholesterol (Calc): 110 mg/dL (calc) (ref <130)
Total CHOL/HDL Ratio: 2.8 (calc) (ref <5.0)
Triglycerides: 91 mg/dL (ref <150)

## 2021-02-01 ENCOUNTER — Encounter: Payer: Self-pay | Admitting: Internal Medicine

## 2021-02-01 ENCOUNTER — Other Ambulatory Visit: Payer: Self-pay

## 2021-02-01 ENCOUNTER — Ambulatory Visit: Payer: BC Managed Care – PPO | Admitting: Internal Medicine

## 2021-02-01 VITALS — BP 128/84 | HR 63 | Temp 98.2°F | Ht 73.0 in | Wt 210.0 lb

## 2021-02-01 DIAGNOSIS — R490 Dysphonia: Secondary | ICD-10-CM

## 2021-02-01 DIAGNOSIS — Z8709 Personal history of other diseases of the respiratory system: Secondary | ICD-10-CM

## 2021-02-01 DIAGNOSIS — J301 Allergic rhinitis due to pollen: Secondary | ICD-10-CM

## 2021-02-01 DIAGNOSIS — I10 Essential (primary) hypertension: Secondary | ICD-10-CM

## 2021-02-01 DIAGNOSIS — E538 Deficiency of other specified B group vitamins: Secondary | ICD-10-CM

## 2021-02-01 DIAGNOSIS — N486 Induration penis plastica: Secondary | ICD-10-CM

## 2021-02-01 DIAGNOSIS — R413 Other amnesia: Secondary | ICD-10-CM

## 2021-02-01 DIAGNOSIS — R351 Nocturia: Secondary | ICD-10-CM

## 2021-02-01 DIAGNOSIS — E7800 Pure hypercholesterolemia, unspecified: Secondary | ICD-10-CM | POA: Diagnosis not present

## 2021-02-01 DIAGNOSIS — K219 Gastro-esophageal reflux disease without esophagitis: Secondary | ICD-10-CM

## 2021-02-01 DIAGNOSIS — N401 Enlarged prostate with lower urinary tract symptoms: Secondary | ICD-10-CM

## 2021-02-01 MED ORDER — AZITHROMYCIN 250 MG PO TABS: ORAL_TABLET | ORAL | 0 refills | Status: AC

## 2021-02-01 MED ORDER — PREDNISONE 10 MG PO TABS: ORAL_TABLET | ORAL | 0 refills | Status: DC

## 2021-02-01 NOTE — Patient Instructions (Addendum)
It was a pleasure to see you today.  Medical issues appear to be stable.  Continue current medications.  Glad you have had flu vaccine and COVID booster.  Return in 6 months.  Have sent in Z-Pak refill and prednisone taper refill in case you have respiratory infection on holiday or weekend or with travel

## 2021-02-01 NOTE — Progress Notes (Signed)
   Subjective:    Patient ID: Melvin Cushing, PhD, male    DOB: 1955/12/15, 65 y.o.   MRN: [id number]  HPI  65 year old Male for 6 month follow up.  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  He has a history of B12 deficiency and gives himself monthly B12 injections.  History of hypertension, allergic rhinitis, asthma.  History of mild memory loss and is not on any medication for memory loss.  History of insomnia.  History of dysphonia but that seems to have improved some.  He does take PPI.  His CBC is basically normal.  MCV fairly elevated at 100.5 and previously was 101.2 in April.  C-Met is completely normal including BUN and creatinine.  Sodium and potassium are normal.  Liver functions are normal.  Lipid panel is normal.  B12 level was not checked.  Review of Systems no new complaints.  Has had flu vaccine and COVID booster.     Objective:   Physical Exam   BMI 27.71, Weight 210 lbs, Height 6 feet 1 inches, BMI 27.71 pulse 63, regular.  Blood pressure 128/84. Skin is warm and dry.  No cervical adenopathy.  No carotid bruits.  No thyromegaly.  Chest is clear.  Cardiac exam: Regular rate and rhythm.  No lower extremity pitting edema.      Assessment & Plan:  Essential hypertension-stable on current regimen of losartan 100 mg daily  GE reflux/dysphonia treated with PPI  B12 deficiency-continue monthly B12 injections  Mild memory loss-stable  History of asthma-stable with albuterol, Singulair, Symbicort  History of allergic rhinitis treated with Flonase and Singulair  History of COVID-19 in May 2022 with no long-term effects  Mild hyperlipidemia treated with low-dose Crestor.  In 2017 total cholesterol was 216 with an LDL cholesterol of 134.  LDL has been elevated for several years.  This is normalized with low-dose statin.  BPH treated with Flomax and Proscar  History of Peyronie's disease seen by urologist  Plan: Continue current medications and follow-up  with health maintenance and wellness exam with fasting labs in 6 months.

## 2021-02-04 ENCOUNTER — Other Ambulatory Visit: Payer: Self-pay | Admitting: Internal Medicine

## 2021-02-18 ENCOUNTER — Other Ambulatory Visit: Payer: Self-pay | Admitting: Internal Medicine

## 2021-03-25 ENCOUNTER — Telehealth: Payer: Self-pay | Admitting: Internal Medicine

## 2021-03-25 NOTE — Telephone Encounter (Signed)
Patient called saying he had large floater appear in eye with flashes of light. Has ophthalmologist with [facility] in [place] but unable to reach oncall physician he says. Answer machine directing him to call 911 so he called me. Suggest he call [facility] and ask for ophthalmologist on call. MJB, MD

## 2021-03-26 ENCOUNTER — Telehealth: Payer: Self-pay

## 2021-03-26 NOTE — Telephone Encounter (Signed)
Appt made

## 2021-03-27 ENCOUNTER — Other Ambulatory Visit: Payer: BC Managed Care – PPO | Admitting: Internal Medicine

## 2021-03-29 ENCOUNTER — Ambulatory Visit: Payer: BC Managed Care – PPO | Admitting: Internal Medicine

## 2021-04-02 ENCOUNTER — Other Ambulatory Visit: Payer: Self-pay | Admitting: Internal Medicine

## 2021-04-09 ENCOUNTER — Telehealth: Payer: Self-pay | Admitting: Internal Medicine

## 2021-04-09 NOTE — Telephone Encounter (Signed)
I called him about his visit to [facility] ED on December 11 th. He has follow up there soon. Was dx with posterior vitreous detachment left eye and has follow up there January 5th. He was asked about BP control and possibility of glucose intolerance. Fasting glucose was 91 in October.Lipid panel was normal. We can do Hgb AIC when he returns.Currently on Losartan only for HTN. Says BP up a bit during the holidays. He will watch it. Is on low dose Crestor 5 mg daily.  MJB, MD

## 2021-04-13 ENCOUNTER — Other Ambulatory Visit: Payer: Self-pay | Admitting: Internal Medicine

## 2021-04-24 ENCOUNTER — Telehealth: Payer: Self-pay

## 2021-04-24 ENCOUNTER — Other Ambulatory Visit: Payer: Self-pay

## 2021-04-24 ENCOUNTER — Telehealth (INDEPENDENT_AMBULATORY_CARE_PROVIDER_SITE_OTHER): Payer: BC Managed Care – PPO | Admitting: Internal Medicine

## 2021-04-24 VITALS — Temp 98.5°F

## 2021-04-24 DIAGNOSIS — Z8709 Personal history of other diseases of the respiratory system: Secondary | ICD-10-CM

## 2021-04-24 DIAGNOSIS — I10 Essential (primary) hypertension: Secondary | ICD-10-CM | POA: Diagnosis not present

## 2021-04-24 DIAGNOSIS — J301 Allergic rhinitis due to pollen: Secondary | ICD-10-CM

## 2021-04-24 DIAGNOSIS — J22 Unspecified acute lower respiratory infection: Secondary | ICD-10-CM

## 2021-04-24 MED ORDER — BENZONATATE 100 MG PO CAPS: 100.0000 mg | ORAL_CAPSULE | Freq: Three times a day (TID) | ORAL | 0 refills | Status: DC | PRN

## 2021-04-24 MED ORDER — AZITHROMYCIN 250 MG PO TABS: ORAL_TABLET | ORAL | 0 refills | Status: DC

## 2021-04-24 NOTE — Telephone Encounter (Addendum)
Patient called asking for something to be sent in for a cough. He states that he may have bronchitis. The cough has been a couple days. He has taken a covid test yesterday and it was negative. Advised he may need to be seen. Please advise. NO fever no congestion., no other symptoms.

## 2021-04-24 NOTE — Telephone Encounter (Signed)
Scheduled

## 2021-04-25 NOTE — Progress Notes (Signed)
° °  Subjective:    Patient ID: Melvin Cushing, PhD, male    DOB: [birth year], 66 y.o.   MRN: [id number]  HPI 66 year old Male seen via interactive audio and video telecommunications today regarding a cough.  Says he had been coughing for couple of days.  Took a home COVID test yesterday and it was negative.  Has no fever, no other symptoms.  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  He has had at least 5 COVID vaccines and had influenza immunization in September 2022.  He had COVID-19 in May 2022.  He has a history of hypertension, B12 deficiency, allergic rhinitis and asthma.  History of mild memory loss.  Patient is agreeable to visit in this format today.  He is identified using 2 identifiers as Melvin Dixon, a longstanding patient in this practice.  He is at his home and I am at my office.  Review of Systems denies fever, chills, sore throat, wheezing.     Objective:   Physical Exam Reports he is afebrile.  Does not appear to be tachypneic and no audible wheezing is heard.  Sounds a bit hoarse when he speaks.       Assessment & Plan:  Acute lower respiratory infection  History of asthma and allergic rhinitis  Plan: Patient will be treated with doxycycline 100 mg twice daily for 10 days, Tessalon Perles 100 mg up to 3 times daily as needed for cough which are nonsedating.  Hycodan 1 teaspoon every 8 hours as needed for cough particularly at night so he can sleep.  Medrol 4 mg tablets to take in a tapering course starting with 6 tablets day 1 and decreasing by 1 tablet daily i.e. 6-5-4-3-2-1 taper.  Call if not better in 3 to 5 days or sooner if worse.  Monitor pulse oximetry at home.  Time spent with virtual visit including interviewing patient, reviewing chart, medical decision making and E scribing medications is 15 minutes

## 2021-05-01 ENCOUNTER — Ambulatory Visit
Admission: RE | Admit: 2021-05-01 | Discharge: 2021-05-01 | Disposition: A | Discharge status: Home / Self Care | Payer: BC Managed Care – PPO | Source: Ambulatory Visit | Attending: Internal Medicine | Admitting: Internal Medicine

## 2021-05-01 ENCOUNTER — Telehealth: Payer: Self-pay

## 2021-05-01 ENCOUNTER — Other Ambulatory Visit: Payer: Self-pay

## 2021-05-01 ENCOUNTER — Ambulatory Visit: Payer: BC Managed Care – PPO | Admitting: Internal Medicine

## 2021-05-01 ENCOUNTER — Encounter: Payer: Self-pay | Admitting: Internal Medicine

## 2021-05-01 VITALS — BP 118/82 | HR 74 | Temp 97.6°F

## 2021-05-01 DIAGNOSIS — R053 Chronic cough: Secondary | ICD-10-CM | POA: Diagnosis not present

## 2021-05-01 MED ORDER — HYDROCODONE BIT-HOMATROP MBR 5-1.5 MG/5ML PO SOLN: 5.0000 mL | Freq: Three times a day (TID) | ORAL | 0 refills | Status: DC | PRN

## 2021-05-01 MED ORDER — BENZONATATE 100 MG PO CAPS: 100.0000 mg | ORAL_CAPSULE | Freq: Three times a day (TID) | ORAL | 0 refills | Status: DC | PRN

## 2021-05-01 MED ORDER — DOXYCYCLINE HYCLATE 100 MG PO TABS: 100.0000 mg | ORAL_TABLET | Freq: Two times a day (BID) | ORAL | 0 refills | Status: DC

## 2021-05-01 MED ORDER — METHYLPREDNISOLONE 4 MG PO TABS: ORAL_TABLET | ORAL | 0 refills | Status: DC

## 2021-05-01 MED ORDER — CEFTRIAXONE SODIUM 1 G IJ SOLR
1.0000 g | Freq: Once | INTRAMUSCULAR | Status: AC
  Administered 2021-05-01: 1 g via INTRAMUSCULAR

## 2021-05-01 NOTE — Telephone Encounter (Signed)
Patient finished course of abx but still has a lot oncongestion and productive cough. He belongs to a RSV study and the nurse came out and tested but told him it would take a week or two to get results but that he should talk to his doctor for another abx. He would like to know what to do. Cough is worse at night. Please advise.

## 2021-05-01 NOTE — Telephone Encounter (Signed)
scheduled

## 2021-05-01 NOTE — Telephone Encounter (Signed)
Xray ordered. Appt made for today.

## 2021-05-01 NOTE — Progress Notes (Signed)
° °  Subjective:    Patient ID: Melvin Cushing, PhD, male    DOB: 1955-12-12, 66 y.o.   MRN: [id number]  HPI Patient seen today regarding persistent cough.  He was seen virtually on  January 10 with cough and congestion.  He was prescribed Zithromax Z-PAK and Tessalon Perles.  Patient says he still has a lot of congestion and productive cough.  He participates in an RSV study and the nurse came out and tested him for RSV and said it would take a week or 2 to get results but that he should speak to me about another antibiotic.  We have sent him for chest x-ray today and it is negative.  Cough is worse at night.  Office visit was advised today.  He has Ventolin and Symbicort inhalers. Respiratory Virus panel obtained here today and sent to [facility].  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 Has considerable hoarseness.   Home Covid test January 10th was negative.  Last COVID booster was September 2022.  Had flu vaccine in September 2022.  He is a candidate for pneumococcal 20 vaccine when he recovers from this illness.  He is having issues with vitreous floaters of left eye and is being seen at [facility].  He has a history of mild memory loss.  He has a history of asthma, allergic rhinitis, hypertension.  Currently not on any medication for memory loss.  History of insomnia.  History of dysphonia but that has improved.  He does take PPI.  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  History of B12 deficiency and gives himself monthly B12 injections.  Review of Systems see above denies fever or shaking chills or hypoxia     Objective:   Physical Exam Blood pressure 118/82 pulse 74 temperature 97.6 degrees pulse oximetry 97%.  Does not appear to be tachypneic.  He sounds hoarse when he speaks.  TMs are pink bilaterally with splayed light reflexes.  Pharynx very slightly injected.  No exudate noted.  Neck is supple.  Chest is clear to auscultation without rales  or wheezing.  Chest x-ray shows no pneumonia.     Assessment & Plan:   Persistent lower respiratory issues with persistent cough.  Respiratory virus panel obtained.  He will be treated with doxycycline 100 mg twice daily for 10 days, Tessalon Perles 100 mg up to 3 times daily as needed for cough, Hycodan 1 teaspoon every 8 hours as needed for cough/sleep.  He will be treated with Medrol 4 mg tablets to take in tapering course starting with 6 tablets day 1 and decreasing by 1 tablet daily i.e. 6-5-4-3-2-1 taper.  Given 1 g IM Rocephin in the office today.

## 2021-05-01 NOTE — Patient Instructions (Addendum)
1 g IM Rocephin.  Tessalon Perles 100 mg up to 3 times daily as needed for cough.  Hycodan 1 teaspoon every 8 hours as needed for cough not relieved with Tessalon.  Patient will take Medrol 4 mg tablets and tapering course starting with 6 tablets day 1 and decreasing by 1 tablet daily i.e. 6-5-4-3-2-1 taper.  Levaquin 500 mg daily for 10 days.  Respiratory virus panel obtained.  Continue albuterol and Symbicort inhalers.  Stay well-hydrated.  Chest x-ray is negative.  Needs to have pneumococcal 20 vaccine after he is over this illness.

## 2021-05-03 LAB — RESPIRATORY VIRUS PANEL
Adenovirus B: DETECTED — AB
HUMAN PARAINFLU VIRUS 1: NOT DETECTED
HUMAN PARAINFLU VIRUS 2: NOT DETECTED
HUMAN PARAINFLU VIRUS 3: NOT DETECTED
INFLUENZA A SUBTYPE H1: NOT DETECTED
INFLUENZA A SUBTYPE H3: NOT DETECTED
Influenza A: NOT DETECTED
Influenza B: NOT DETECTED
Metapneumovirus: NOT DETECTED
Respiratory Syncytial Virus A: DETECTED — AB
Respiratory Syncytial Virus B: NOT DETECTED
Rhinovirus: NOT DETECTED

## 2021-05-06 ENCOUNTER — Encounter: Payer: Self-pay | Admitting: Internal Medicine

## 2021-05-06 NOTE — Patient Instructions (Signed)
Take doxycycline 100 mg twice daily for 10 days.  Tessalon Perles 100 mg up to 3 times daily as needed for cough.  Hycodan 1 teaspoon every 8 hours as needed for cough particularly at night so you can get some sleep.  Take Medrol 4 mg tablets in tapering course as directed starting with 6 tablets day 1 and decreasing by 1 tablet daily.  Rest and drink fluids and monitor pulse oximetry.  Call if not better in 3 to 5 days.

## 2021-06-11 ENCOUNTER — Other Ambulatory Visit: Payer: Self-pay | Admitting: Internal Medicine

## 2021-08-02 ENCOUNTER — Other Ambulatory Visit: Payer: BC Managed Care – PPO

## 2021-08-02 DIAGNOSIS — E538 Deficiency of other specified B group vitamins: Secondary | ICD-10-CM

## 2021-08-02 DIAGNOSIS — N401 Enlarged prostate with lower urinary tract symptoms: Secondary | ICD-10-CM

## 2021-08-02 DIAGNOSIS — E7800 Pure hypercholesterolemia, unspecified: Secondary | ICD-10-CM

## 2021-08-02 DIAGNOSIS — I10 Essential (primary) hypertension: Secondary | ICD-10-CM

## 2021-08-03 LAB — CBC WITH DIFFERENTIAL/PLATELET
Absolute Monocytes: 508 cells/uL (ref 200–950)
Basophils Absolute: 20 cells/uL (ref 0–200)
Basophils Relative: 0.5 %
Eosinophils Absolute: 112 cells/uL (ref 15–500)
Eosinophils Relative: 2.8 %
HCT: 44 % (ref 38.5–50.0)
Hemoglobin: 14.8 g/dL (ref 13.2–17.1)
Lymphs Abs: 1204 cells/uL (ref 850–3900)
MCH: 34 pg — ABNORMAL HIGH (ref 27.0–33.0)
MCHC: 33.6 g/dL (ref 32.0–36.0)
MCV: 101.1 fL — ABNORMAL HIGH (ref 80.0–100.0)
MPV: 11 fL (ref 7.5–12.5)
Monocytes Relative: 12.7 %
Neutro Abs: 2156 cells/uL (ref 1500–7800)
Neutrophils Relative %: 53.9 %
Platelets: 118 10*3/uL — ABNORMAL LOW (ref 140–400)
RBC: 4.35 10*6/uL (ref 4.20–5.80)
RDW: 13.5 % (ref 11.0–15.0)
Total Lymphocyte: 30.1 %
WBC: 4 10*3/uL (ref 3.8–10.8)

## 2021-08-03 LAB — COMPLETE METABOLIC PANEL WITH GFR
AG Ratio: 2 (calc) (ref 1.0–2.5)
ALT: 30 U/L (ref 9–46)
AST: 21 U/L (ref 10–35)
Albumin: 4.3 g/dL (ref 3.6–5.1)
Alkaline phosphatase (APISO): 58 U/L (ref 35–144)
BUN: 12 mg/dL (ref 7–25)
CO2: 28 mmol/L (ref 20–32)
Calcium: 9.3 mg/dL (ref 8.6–10.3)
Chloride: 104 mmol/L (ref 98–110)
Creat: 1.17 mg/dL (ref 0.70–1.35)
Globulin: 2.1 g/dL (calc) (ref 1.9–3.7)
Glucose, Bld: 94 mg/dL (ref 65–99)
Potassium: 4.8 mmol/L (ref 3.5–5.3)
Sodium: 140 mmol/L (ref 135–146)
Total Bilirubin: 1 mg/dL (ref 0.2–1.2)
Total Protein: 6.4 g/dL (ref 6.1–8.1)
eGFR: 69 mL/min/{1.73_m2} (ref >=60)

## 2021-08-03 LAB — LIPID PANEL
Cholesterol: 180 mg/dL (ref <200)
HDL: 62 mg/dL (ref >=40)
LDL Cholesterol (Calc): 99 mg/dL (calc)
Non-HDL Cholesterol (Calc): 118 mg/dL (calc) (ref <130)
Total CHOL/HDL Ratio: 2.9 (calc) (ref <5.0)
Triglycerides: 96 mg/dL (ref <150)

## 2021-08-03 LAB — VITAMIN B12: Vitamin B-12: 511 pg/mL (ref 200–1100)

## 2021-08-03 LAB — PSA: PSA: 0.62 ng/mL (ref <=4.00)

## 2021-08-06 ENCOUNTER — Other Ambulatory Visit: Payer: Self-pay | Admitting: Internal Medicine

## 2021-08-07 ENCOUNTER — Encounter: Payer: Self-pay | Admitting: Internal Medicine

## 2021-08-07 ENCOUNTER — Ambulatory Visit (INDEPENDENT_AMBULATORY_CARE_PROVIDER_SITE_OTHER): Payer: BC Managed Care – PPO | Admitting: Internal Medicine

## 2021-08-07 VITALS — BP 136/86 | HR 63 | Temp 97.2°F | Ht 73.5 in | Wt 219.0 lb

## 2021-08-07 DIAGNOSIS — N401 Enlarged prostate with lower urinary tract symptoms: Secondary | ICD-10-CM

## 2021-08-07 DIAGNOSIS — I10 Essential (primary) hypertension: Secondary | ICD-10-CM | POA: Diagnosis not present

## 2021-08-07 DIAGNOSIS — Z23 Encounter for immunization: Secondary | ICD-10-CM

## 2021-08-07 DIAGNOSIS — N486 Induration penis plastica: Secondary | ICD-10-CM

## 2021-08-07 DIAGNOSIS — Z0000 Encounter for general adult medical examination without abnormal findings: Secondary | ICD-10-CM

## 2021-08-07 DIAGNOSIS — E7800 Pure hypercholesterolemia, unspecified: Secondary | ICD-10-CM | POA: Diagnosis not present

## 2021-08-07 DIAGNOSIS — R413 Other amnesia: Secondary | ICD-10-CM | POA: Diagnosis not present

## 2021-08-07 DIAGNOSIS — E538 Deficiency of other specified B group vitamins: Secondary | ICD-10-CM

## 2021-08-07 DIAGNOSIS — K219 Gastro-esophageal reflux disease without esophagitis: Secondary | ICD-10-CM

## 2021-08-07 DIAGNOSIS — Z8709 Personal history of other diseases of the respiratory system: Secondary | ICD-10-CM

## 2021-08-07 DIAGNOSIS — R351 Nocturia: Secondary | ICD-10-CM

## 2021-08-07 DIAGNOSIS — J3089 Other allergic rhinitis: Secondary | ICD-10-CM

## 2021-08-07 LAB — POCT URINALYSIS DIPSTICK
Bilirubin, UA: NEGATIVE
Glucose, UA: NEGATIVE
Ketones, UA: NEGATIVE
Leukocytes, UA: NEGATIVE
Nitrite, UA: NEGATIVE
Protein, UA: NEGATIVE
Spec Grav, UA: 1.015 (ref 1.010–1.025)
Urobilinogen, UA: 0.2 E.U./dL
pH, UA: 6 (ref 5.0–8.0)

## 2021-08-07 NOTE — Progress Notes (Signed)
? ?Subjective:  ? ? Patient ID: Melvin Cushing, PhD, male    DOB: [birth year], 66 y.o.   MRN: [id number] ? ?HPI 66 year old Male seen for annual health maintenance exam and evaluation of medical issues. ? ?In December 2022 he developed numerous floaters in the left eye and was seen emergently at [facility].  He was referred to ophthalmology and there and was seen in January 2023 with a posterior vitreous detachment of left eye.  He describes this as a new floater that is rather large did develop suddenly on December 10.  At that time noticed some intermittent flashing lights in the temporal periphery of the left eye notable when he was in a dark room.  History of myopia.  Has seen Dr. Valma Cava, ophthalmologist in October 2022.  Ocular pressure has been normal.  Had follow-up at [facility] with Dr. Valma Cava March 23 of this year.  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  Has cataracts of both eyes.  Is treated with Restasis.  He wears corrective lenses.  Follow-up recommended in 1 year by Dr. Valma Cava. ? ?History of upper eyelid blepharoplasty [facility] February 2021.  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 ? ?History of BPH for which he takes Flomax.  He is also on Proscar 5 mg daily. ? ?Patient was given pneumococcal 20 vaccine today.  At next visit should get tetanus update.  Last tetanus vaccine on file was 2011.  Had COVID booster April 2023.  Gets annual flu vaccine. ? ?Had colonoscopy in 2018.  Repeat study due 2028. ? ?History of B12 deficiency and gives himself monthly B12 injections. ? ?History of hypertension, allergic rhinitis and asthma.  History of mild memory loss but does not take any medication for it.  History of insomnia. ? ?Was diagnosed in the past with dysphonia.  Does not seem to have a hoarse voice at this time.  He had voice therapy but it did not help very much through [facility].  He does take Nexium 40 mg  daily. ? ?History of asthma treated with Symbicort. ? 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 ? ?Social history: He is married.  Wife only works for [employer] but now has her own consulting business and works from home.  Patient commutes from [place] to work at [employer].  He has 2 daughters from a previous marriage.  Non-smoker.  Social alcohol consumption but not very often. ? ?History of Peyronie's disease being observed by [facility].  Takes Flomax and Proscar. ? ?Is seen Dr. Lake Bells regarding mild persistent asthma in 2018.  He used to receive immunotherapy for allergic rhinitis prior to the pandemic but has not done so recently. ? ?In 2018 he had an acute attack of severe vertigo.  He had low potassium is 3.3.  He was given potassium supplement during evaluation in the emergency department.  Has not had recurrence. ? ?Developed COVID-19 in the spring 2022. ? ?History of right lower lobe pneumonia December 1994.  Prostatitis March 2000.  Vasectomy December 1993. ? ?In May 2013 Dr. Sherrye Payor removed basal cell carcinoma from right sideburn area.  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 ? ?At 89 was seen at [facility] for memory loss in the remote past.  He was tried on Zoloft there by Dr. Werner Lean for possible depression without much improvement.  He later found out insurance did not cover visits there. ? ?  Family history: History of dementia in mother.  Father died at age 9 after several heart attacks.  1 brother in good health.  6 sisters living.  Both parents had diabetes.  Father had history of hypertension.  Some siblings with seizure disorder.  1 sister died with lung cancer with history of smoking. ? ?In January he had a protracted respiratory infection and respiratory virus panel was positive for both Adenovirus B and Respiratory syncytial virus A ? ?Review of Systems denies chest pain or shortness of breath.  Denies urinary issues.  Recent eye exam  results reviewed. ? ?   ?Objective:  ? Physical Exam ?Blood pressure 136/86 pulse 63 temperature 97.2 degrees by tympanic thermometer pulse oximetry 96% weight 219 pounds BMI 28.50 ?Skin: Warm and dry.  No cervical adenopathy.  No carotid bruits.  Chest is clear to auscultation.  TMs are clear.  Pharynx is clear.  No thyromegaly.  Cardiac exam: Regular rate and rhythm without ectopy.  Abdomen soft nondistended without hepatosplenomegaly masses or tenderness.  Prostate is normal without nodules.  No stool to guaiac.  No lower extremity edema.  Brief neurological exam is intact without gross focal deficits.  His affect thought and judgment at this time appear to be normal. ? ? ?   ?Assessment & Plan:  ?Essential hypertension-stable on current regimen ? ?History of Peyronie's disease being monitored by urologist ? ?History of BPH treated with Proscar and Flomax ? ?History of B12 deficiency-he gives himself monthly B12 injections.  His MCV is normal and he is not anemic. ? ?History of asthma and allergic rhinitis.  Has not been taking allergy injections for the past 2 or 3 years during the pandemic. ? ?History of dysphonia-improved and does not need to see voice therapy at this time ? ?Pure hypercholesterolemia treated with low-dose Crestor daily and stable ? ?Family history of heart disease in father ? ?History of memory loss in mother ? ?History of GE reflux treated with PPI ? ?History of bilateral cataracts ? ?History of posterior vitreous detachment of left eye followed at [facility] which she describes as a large floater in his left eye. ? ?Plan: He will continue to give himself monthly B12 injections.  He will continue with losartan 100 mg daily.  He will continue with Nexium for longstanding history of GE reflux and prior history of dysphonia.  He will continue with as needed albuterol inhaler and twice daily Symbicort.  We will continue with Crestor 5 mg daily for mild hyperlipidemia.  His lipid panel was  normal.  He will continue with Flomax for BPH as well as Proscar. ? ?

## 2021-08-07 NOTE — Patient Instructions (Addendum)
It was a pleasure to see you today.  Labs are stable.  Continue current medications and follow-up in 6 months.  Pneumococcal 20 vaccine given today.  Continue diet and exercise regimen.  Colonoscopy is up-to-date. ?

## 2021-09-06 ENCOUNTER — Ambulatory Visit: Payer: BC Managed Care – PPO | Admitting: Podiatry

## 2021-09-06 DIAGNOSIS — L600 Ingrowing nail: Secondary | ICD-10-CM | POA: Diagnosis not present

## 2021-09-06 MED ORDER — NEOMYCIN-POLYMYXIN-HC 3.5-10000-1 OT SOLN: OTIC | 0 refills | Status: DC

## 2021-09-06 NOTE — Patient Instructions (Signed)

## 2021-09-09 NOTE — Progress Notes (Signed)
Subjective:  Patient ID: Melvin Cushing, PhD, male    DOB: 11/02/1955,  MRN: [id number] HPI Chief Complaint  Patient presents with   Foot Problem    Nail Trim not diabetic    66 y.o. male presents with the above complaint.   ROS: Denies fever chills nausea vomiting muscle aches pains calf pain back pain chest pain shortness of breath.  Past Medical History:  Diagnosis Date   Allergy    Anxiety    2 years ago no longer on meds  Zoloft   Arthritis    osteoarthritis   Asthma    Basal cell carcinoma    Cancer (HCC)    Hyperlipidemia    Hypertension    Memory loss    Vision abnormalities    migraines   Past Surgical History:  Procedure Laterality Date   KNEE ARTHROSCOPY W/ MENISCAL REPAIR Right     Current Outpatient Medications:    neomycin-polymyxin-hydrocortisone (CORTISPORIN) OTIC solution, Apply one to two drops to toe after soaking twice daily., Disp: 10 mL, Rfl: 0   acetaminophen (TYLENOL) 500 MG tablet, Take 500 mg by mouth as needed., Disp: , Rfl:    albuterol (VENTOLIN HFA) 108 (90 Base) MCG/ACT inhaler, INHALE 2 PUFFS INTO THE LUNGS EVERY 6 HOURS AS NEEDED FOR WHEEZING, Disp: 18 g, Rfl: prn   cetirizine (ZYRTEC) 10 MG tablet, Take 10 mg by mouth daily., Disp: , Rfl:    cyanocobalamin (,VITAMIN B-12,) 1000 MCG/ML injection, INJECT 1ML INTRAMUSCULARLY ONCE A MONTH, Disp: 10 mL, Rfl: 1   cycloSPORINE (RESTASIS) 0.05 % ophthalmic emulsion, , Disp: , Rfl:    doxycycline (ADOXA) 50 MG tablet, Take 50 mg by mouth daily., Disp: , Rfl:    esomeprazole (NEXIUM) 40 MG capsule, TAKE 1 CAPSULE BY MOUTH ONCE DAILY, Disp: 90 capsule, Rfl: 3   finasteride (PROSCAR) 5 MG tablet, Take 5 mg by mouth daily., Disp: , Rfl:    fluticasone (FLONASE) 50 MCG/ACT nasal spray, USE 2 SPRAYS INTO EACH NOSTRIL TWICE DAILY, Disp: 16 g, Rfl: prn   losartan (COZAAR) 100 MG tablet, TAKE 1 TABLET BY MOUTH ONCE DAILY, Disp: 90 tablet, Rfl: 2   montelukast (SINGULAIR) 10 MG tablet, TAKE 1 TABLET BY MOUTH  AT BEDTIME, Disp: 90 tablet, Rfl: 3   potassium chloride SA (KLOR-CON M) 20 MEQ tablet, TAKE 1 TABLET BY MOUTH ONCE DAILY, Disp: 90 tablet, Rfl: 2   rosuvastatin (CRESTOR) 5 MG tablet, TAKE 1 TABLET BY MOUTH ONCE DAILY, Disp: 90 tablet, Rfl: 3   SYMBICORT 160-4.5 MCG/ACT inhaler, INHALE 2 PUFFS TWICE A DAY RINSE MOUTH WITH WATER AFTER EACH USE, Disp: 10.2 g, Rfl: prn   tamsulosin (FLOMAX) 0.4 MG CAPS capsule, Take 1 capsule (0.4 mg total) by mouth daily., Disp: 30 capsule, Rfl: 3  Current Facility-Administered Medications:    0.9 %  sodium chloride infusion, 500 mL, Intravenous, Continuous, Nandigam, Kavitha V, MD  No Known Allergies Review of Systems Objective:  There were no vitals filed for this visit.  General: Well developed, nourished, in no acute distress, alert and oriented x3   Dermatological: Skin is warm, dry and supple bilateral. Nails x 10 are well maintained; remaining integument appears unremarkable at this time. There are no open sores, no preulcerative lesions, no rash or signs of infection present.  Hallux nail demonstrates shortening abraded nail margin with mild erythema along the tibial fibular borders.  No purulence no malodor tender on palpation.  Vascular: Dorsalis Pedis artery and Posterior Tibial artery pedal pulses  are 2/4 bilateral with immedate capillary fill time. Pedal hair growth present. No varicosities and no lower extremity edema present bilateral.   Neruologic: Grossly intact via light touch bilateral. Vibratory intact via tuning fork bilateral. Protective threshold with Semmes Wienstein monofilament intact to all pedal sites bilateral. Patellar and Achilles deep tendon reflexes 2+ bilateral. No Babinski or clonus noted bilateral.   Musculoskeletal: No gross boney pedal deformities bilateral. No pain, crepitus, or limitation noted with foot and ankle range of motion bilateral. Muscular strength 5/5 in all groups tested bilateral.  Gait: Unassisted,  Nonantalgic.    Radiographs:  None taken  Assessment & Plan:   Assessment: Ingrown toenails tibial-fibular border the hallux bilateral.  Plan: After discussion of options today we just decided on medical matricectomy to the tibial fibular border the hallux bilateral.  He tolerated this procedure well after local anesthetic was administered.  He was given both oral and home-going instruction for the care and soaking of the toes.  He was also provided with prescription for Cortisporin Otic to be applied twice daily after soaking.  He understands this is amenable to it follow-up with me in 2 weeks he will call with questions or concerns.     Carnie Bruemmer T. [place], [place]

## 2021-09-12 ENCOUNTER — Encounter: Payer: Self-pay | Admitting: Internal Medicine

## 2021-09-12 ENCOUNTER — Telehealth: Payer: Self-pay | Admitting: Internal Medicine

## 2021-09-12 ENCOUNTER — Other Ambulatory Visit: Payer: Self-pay | Admitting: Internal Medicine

## 2021-09-12 ENCOUNTER — Telehealth (INDEPENDENT_AMBULATORY_CARE_PROVIDER_SITE_OTHER): Payer: BC Managed Care – PPO | Admitting: Internal Medicine

## 2021-09-12 VITALS — Temp 98.7°F

## 2021-09-12 DIAGNOSIS — Z8709 Personal history of other diseases of the respiratory system: Secondary | ICD-10-CM | POA: Diagnosis not present

## 2021-09-12 DIAGNOSIS — J22 Unspecified acute lower respiratory infection: Secondary | ICD-10-CM

## 2021-09-12 MED ORDER — DOXYCYCLINE HYCLATE 100 MG PO TABS: 100.0000 mg | ORAL_TABLET | Freq: Two times a day (BID) | ORAL | 0 refills | Status: DC

## 2021-09-12 MED ORDER — HYDROCODONE BIT-HOMATROP MBR 5-1.5 MG/5ML PO SOLN: 5.0000 mL | Freq: Three times a day (TID) | ORAL | 0 refills | Status: DC | PRN

## 2021-09-12 MED ORDER — METHYLPREDNISOLONE 4 MG PO TABS: ORAL_TABLET | ORAL | 0 refills | Status: DC

## 2021-09-12 MED ORDER — BENZONATATE 100 MG PO CAPS: 100.0000 mg | ORAL_CAPSULE | Freq: Three times a day (TID) | ORAL | 1 refills | Status: DC | PRN

## 2021-09-12 NOTE — Progress Notes (Signed)
   Subjective:    Patient ID: Melvin Cushing, PhD, male    DOB: [birth year], 66 y.o.   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  He is at his office at [facility] and I am at my office.  He is agreeable to visit in this format today.  Patient says he began to feel ill and developed a light cough on Monday, May 29.  Tuesday symptoms were worse.  He has no fever or chills.  Has discolored sputum production.  Temperature is 98.7 degrees and he is slightly short of breath.  He has a history of reactive airways disease.  He has right ear discomfort/congestion.  He has an inhaler for asthma.  He is concerned because he has asthma and some mild respiratory distress and the face of this respiratory infection.  He last had a COVID booster in September 2022.    Review of Systems see above     Objective:   Physical Exam He is seen virtually and sounds nasally congested.  No audible wheezing.  Looks slightly fatigued.  Does not appear to be tachypneic at this time.       Assessment & Plan:  Acute lower respiratory infection  History of reactive airways disease  Plan: Patient will take doxycycline 100 mg twice daily for 10 days.  Tessalon Perles 100 mg up to 3 times daily as needed for cough.  He will take Medrol in tapering course as directed (Medrol Dosepak) for 6 days.  He request Hycodan for cough.  This will be prescribed 1 teaspoon every 8 hours as needed for cough.  Advised to rest and stay well-hydrated.  Monitor pulse oximetry and call if not improving within 48 hours or sooner if worse.

## 2021-09-12 NOTE — Patient Instructions (Signed)
Take doxycycline 100 mg twice daily for 10 days.  Take Tessalon Perles 100 mg up to 3 times daily as needed for cough.  Take Medrol 4 mg tablets in tapering course as directed starting with 6 tablets day 1 and decreasing by 1 tablet daily.  Hycodan 1 teaspoon every 8 hours as needed for cough.  Rest and drink plenty of fluids and monitor pulse oximetry.  Call if not better in 48 hours or sooner if worse.

## 2021-09-12 NOTE — Telephone Encounter (Signed)
Scheduled

## 2021-09-12 NOTE — Telephone Encounter (Signed)
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

## 2021-09-18 ENCOUNTER — Ambulatory Visit
Admission: RE | Admit: 2021-09-18 | Discharge: 2021-09-18 | Disposition: A | Discharge status: Home / Self Care | Payer: BC Managed Care – PPO | Source: Ambulatory Visit | Attending: Internal Medicine | Admitting: Internal Medicine

## 2021-09-18 ENCOUNTER — Ambulatory Visit: Payer: BC Managed Care – PPO | Admitting: Internal Medicine

## 2021-09-18 ENCOUNTER — Encounter: Payer: Self-pay | Admitting: Internal Medicine

## 2021-09-18 VITALS — BP 124/90 | HR 61 | Temp 97.6°F

## 2021-09-18 DIAGNOSIS — I10 Essential (primary) hypertension: Secondary | ICD-10-CM

## 2021-09-18 DIAGNOSIS — R0989 Other specified symptoms and signs involving the circulatory and respiratory systems: Secondary | ICD-10-CM | POA: Diagnosis not present

## 2021-09-18 DIAGNOSIS — R413 Other amnesia: Secondary | ICD-10-CM

## 2021-09-18 DIAGNOSIS — E7800 Pure hypercholesterolemia, unspecified: Secondary | ICD-10-CM | POA: Diagnosis not present

## 2021-09-18 DIAGNOSIS — J22 Unspecified acute lower respiratory infection: Secondary | ICD-10-CM

## 2021-09-18 DIAGNOSIS — Z8709 Personal history of other diseases of the respiratory system: Secondary | ICD-10-CM

## 2021-09-18 LAB — CBC WITH DIFFERENTIAL/PLATELET
Absolute Monocytes: 625 cells/uL (ref 200–950)
Basophils Absolute: 11 cells/uL (ref 0–200)
Basophils Relative: 0.2 %
Eosinophils Absolute: 111 cells/uL (ref 15–500)
Eosinophils Relative: 2.1 %
HCT: 42.9 % (ref 38.5–50.0)
Hemoglobin: 14.7 g/dL (ref 13.2–17.1)
Lymphs Abs: 1802 cells/uL (ref 850–3900)
MCH: 33.9 pg — ABNORMAL HIGH (ref 27.0–33.0)
MCHC: 34.3 g/dL (ref 32.0–36.0)
MCV: 98.8 fL (ref 80.0–100.0)
MPV: 10.7 fL (ref 7.5–12.5)
Monocytes Relative: 11.8 %
Neutro Abs: 2751 cells/uL (ref 1500–7800)
Neutrophils Relative %: 51.9 %
Platelets: 133 10*3/uL — ABNORMAL LOW (ref 140–400)
RBC: 4.34 10*6/uL (ref 4.20–5.80)
RDW: 13.3 % (ref 11.0–15.0)
Total Lymphocyte: 34 %
WBC: 5.3 10*3/uL (ref 3.8–10.8)

## 2021-09-18 MED ORDER — METHYLPREDNISOLONE 4 MG PO TABS: ORAL_TABLET | ORAL | 0 refills | Status: DC

## 2021-09-18 MED ORDER — HYDROCODONE BIT-HOMATROP MBR 5-1.5 MG/5ML PO SOLN: 5.0000 mL | Freq: Three times a day (TID) | ORAL | 0 refills | Status: DC | PRN

## 2021-09-18 MED ORDER — AZITHROMYCIN 250 MG PO TABS: ORAL_TABLET | ORAL | 0 refills | Status: DC

## 2021-09-18 MED ORDER — CEFTRIAXONE SODIUM 1 G IJ SOLR
1.0000 g | Freq: Once | INTRAMUSCULAR | Status: AC
  Administered 2021-09-18: 1 g via INTRAMUSCULAR

## 2021-09-18 NOTE — Progress Notes (Signed)
   Subjective:    Patient ID: Melvin Cushing, PhD, male    DOB: 09-20-1955, 66 y.o.   MRN: [id number]  HPI 66 year old  Male has been participating in RSV clinical trials and was seen virtually on May 31 with an acute lower respiratory infection.  Patient reported that he began to feel ill and developed a light cough on Monday, May 29.  The following day ,his symptoms were worse.  He denied fever or chills.  Noted discolored sputum.  Was afebrile and mentioned that he was slightly short of breath.  He does have history of reactive airways disease.  Was having some right ear discomfort.  He was treated with doxycycline for 10 days and Tessalon Perles as needed for cough.  He also was given a Medrol Dosepak to take in tapering course for 6 days and Hycodan for cough.  He is now back in the office for reevaluation.  Reports that he is not completely well and is concerned.  He thinks he may have pneumonia.  Patient has history of mild memory loss, asthma, B12 deficiency treated with monthly B12 injections, allergic rhinitis, history of hypertension.    Review of Systems see above-has productive cough and he can hear chest congestion.  He was sent for chest x-ray today which is negative for pneumonia or heart failure.     Objective:   Physical Exam Blood pressure 124/90, pulse 61 regular, temperature 97.6 degrees, pulse oximetry 94%  Skin: Warm and dry.  Nodes none.  TMs clear.  Pharynx is clear.  Neck is supple.  Chest has some faint rales in left lower lobe.  He is not tachypneic and respiratory rate is normal.       Assessment & Plan:  Protracted respiratory infection possible pneumonia  Patient was sent for chest x-ray which was negative  History of asthma  Mild memory loss  Hypertension-stable on current regimen  History of B12 deficiency-takes monthly B12 injections  In early May,he was treated with Zithromax Z-PAK and improved.  Another Zithromax Z-PAK has been prescribed today.   His chest x-ray today is negative.  He will be given 1 g IM Rocephin in the office today and started on tapering course of Medrol.  Was prescribed Tessalon Perles at last visit on May 31 for cough.  Today he has been prescribed Hycodan for cough to take sparingly every 6 hours as needed.  Today, CBC with differential ordered and white blood cell count is normal at 5300.

## 2021-09-20 ENCOUNTER — Ambulatory Visit: Payer: BC Managed Care – PPO | Admitting: Podiatry

## 2021-09-28 ENCOUNTER — Encounter: Payer: Self-pay | Admitting: Internal Medicine

## 2021-10-02 ENCOUNTER — Ambulatory Visit: Payer: BC Managed Care – PPO | Admitting: Podiatry

## 2021-10-02 ENCOUNTER — Encounter: Payer: Self-pay | Admitting: Podiatry

## 2021-10-02 DIAGNOSIS — M79676 Pain in unspecified toe(s): Secondary | ICD-10-CM | POA: Diagnosis not present

## 2021-10-02 DIAGNOSIS — Z9889 Other specified postprocedural states: Secondary | ICD-10-CM

## 2021-10-02 DIAGNOSIS — L600 Ingrowing nail: Secondary | ICD-10-CM

## 2021-10-02 DIAGNOSIS — B351 Tinea unguium: Secondary | ICD-10-CM | POA: Diagnosis not present

## 2021-10-02 NOTE — Progress Notes (Signed)
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  States that they have been doing okay no problems he would like for me to show him how to trim his nails.  Objective: Vital signs are stable alert oriented x3.  Toenails are long thick yellow dystrophic.  His matrixectomy sites are going on to heal very nicely no signs of infection at all.  Assessment: Well-healing surgical toes with nail dystrophy.  Plan: Debridement of nails 1 through 5 bilaterally.

## 2021-10-29 NOTE — Patient Instructions (Addendum)
1 g IM Rocephin given today in the office.  Take Medrol in tapering course as directed.  Take Zithromax Z-PAK as directed.  Take Hycodan sparingly for cough.  Stay well-hydrated.  Rest at home.  Call if not better in 48 to 72 hours or sooner if worse.

## 2021-11-07 ENCOUNTER — Other Ambulatory Visit: Payer: Self-pay | Admitting: Internal Medicine

## 2022-01-08 ENCOUNTER — Encounter: Payer: Self-pay | Admitting: Internal Medicine

## 2022-01-10 NOTE — Telephone Encounter (Signed)
Noted  

## 2022-01-14 ENCOUNTER — Other Ambulatory Visit: Payer: Self-pay | Admitting: Internal Medicine

## 2022-01-29 ENCOUNTER — Telehealth: Payer: Self-pay | Admitting: Internal Medicine

## 2022-01-29 MED ORDER — PREDNISONE 10 MG PO TABS: 10.0000 mg | ORAL_TABLET | Freq: Every day | ORAL | 0 refills | Status: DC

## 2022-01-29 MED ORDER — AZITHROMYCIN 250 MG PO TABS: ORAL_TABLET | ORAL | 0 refills | Status: AC

## 2022-01-29 NOTE — Addendum Note (Signed)
Addended by: Geradine Girt D on: 01/29/2022 01:13 PM   Modules accepted: Orders

## 2022-01-29 NOTE — Telephone Encounter (Signed)
Melvin Dixon [phone]  Melvin Dixon called to see if he could get a steroid taper, he is going out of town this weekend to [place] for a wedding and he said this would be most helpful at this time of year for his breathing. He said he thinks it starts with a p

## 2022-02-05 ENCOUNTER — Other Ambulatory Visit: Payer: BC Managed Care – PPO

## 2022-02-05 DIAGNOSIS — E7800 Pure hypercholesterolemia, unspecified: Secondary | ICD-10-CM

## 2022-02-05 DIAGNOSIS — E785 Hyperlipidemia, unspecified: Secondary | ICD-10-CM

## 2022-02-06 LAB — COMPLETE METABOLIC PANEL WITH GFR
AG Ratio: 2 (calc) (ref 1.0–2.5)
ALT: 27 U/L (ref 9–46)
AST: 18 U/L (ref 10–35)
Albumin: 4.5 g/dL (ref 3.6–5.1)
Alkaline phosphatase (APISO): 64 U/L (ref 35–144)
BUN: 16 mg/dL (ref 7–25)
CO2: 30 mmol/L (ref 20–32)
Calcium: 9.5 mg/dL (ref 8.6–10.3)
Chloride: 102 mmol/L (ref 98–110)
Creat: 1.17 mg/dL (ref 0.70–1.35)
Globulin: 2.2 g/dL (calc) (ref 1.9–3.7)
Glucose, Bld: 91 mg/dL (ref 65–99)
Potassium: 4.4 mmol/L (ref 3.5–5.3)
Sodium: 140 mmol/L (ref 135–146)
Total Bilirubin: 1 mg/dL (ref 0.2–1.2)
Total Protein: 6.7 g/dL (ref 6.1–8.1)
eGFR: 69 mL/min/{1.73_m2} (ref >=60)

## 2022-02-06 LAB — LIPID PANEL
Cholesterol: 190 mg/dL (ref <200)
HDL: 77 mg/dL (ref >=40)
LDL Cholesterol (Calc): 89 mg/dL (calc)
Non-HDL Cholesterol (Calc): 113 mg/dL (calc) (ref <130)
Total CHOL/HDL Ratio: 2.5 (calc) (ref <5.0)
Triglycerides: 137 mg/dL (ref <150)

## 2022-02-07 ENCOUNTER — Encounter: Payer: Self-pay | Admitting: Internal Medicine

## 2022-02-07 ENCOUNTER — Ambulatory Visit: Payer: BC Managed Care – PPO | Admitting: Internal Medicine

## 2022-02-07 VITALS — BP 128/82 | HR 66 | Temp 98.1°F | Ht 73.5 in | Wt 212.8 lb

## 2022-02-07 DIAGNOSIS — E7800 Pure hypercholesterolemia, unspecified: Secondary | ICD-10-CM

## 2022-02-07 MED ORDER — METHYLPREDNISOLONE 4 MG PO TABS
ORAL_TABLET | ORAL | 0 refills | Status: DC
Start: 2022-02-07 — End: 2023-03-17

## 2022-02-07 NOTE — Progress Notes (Addendum)
   Subjective:    Patient ID: Melvin Cushing, PhD, male    DOB: 1955/11/22, 66 y.o.   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 Wants refill on Medrol to have on hand for asthma. Will be slowing down at work soon looking towards retirement.  He has had recent COVID booster and flu vaccine at his pharmacy on September 24.  He has received pneumococcal 20 and April 2023.  Needs to get tetanus immunization updated at some point.  He has had Shingrix series.  He may want to consider RSV vaccine as well.  However he participated in RSV trials during 2023.  History of posterior vitreous detachment of left eye in December 2022 and was evaluated at [facility].  History of BPH for which he takes Flomax and Proscar.  History of B12 deficiency and gives himself month for B12 vaccines.  B12 level is stable.  History of hypertension, allergic rhinitis and asthma.  History of mild memory loss but does not take medication for it.  History of insomnia.  Uses Symbicort for asthma.  Non-smoker.  Social alcohol consumption but not very often.  History of Peyronie's disease and has been seen at [facility] urology.  Takes Flomax and Proscar for BPH.  History of mild persistent asthma and saw Dr. Lake Bells in 2018.  He used to receive immunotherapy for allergic rhinitis prior to the pandemic but has not done so recently.  History of severe vertigo attack in 2018.  Developed COVID-19 in the spring 2022.  Right lower lobe pneumonia December 1994.  Prostatitis March 2000.  Meniscectomy in December 1993.  Basal cell carcinoma right sideburn area May 2013.  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  In January 2023 he had a protracted respiratory infection.  Respiratory virus panel was positive for both Adenovirus B and Respiratory Syncytial Virus A.  Lipid panel and c-Met are stable,     Review of Systems no new complaints      Objective:   Physical Exam  BP 128/82, pulse 66, regular ;T 98.1, pulse ox 99%,  Skin: Warm and dry.  No cervical adenopathy, thyromegaly or carotid bruits.  Chest clear.  Cardiac exam: Regular rate and rhythm.  No lower extremity pitting edema.  Affect is normal.     Assessment & Plan:  He has reactive airways disease-have prescribed Medrol Dosepak 4 mg to take in tapering course for bronchospasm should he have a recurrence of reactive airways disease in the next few months.  He will continue with rosuvastatin 5 mg daily, Flomax, Symbicort, Singulair losartan Flonase and Proscar as well as Nexium.  Continue with Zyrtec.  Has Ventolin inhaler on hand.  Vaccines discussed  Plan: Return in 6 months.  Coronary calcium scoring study ordered.

## 2022-02-07 NOTE — Patient Instructions (Addendum)
It was a pleasure to see you today.  We have prescribed steroid Dosepak at your request to have on hand.  No change in medications.  Return in 6 months.  Coronary calcium scoring study discussed with patient and ordered.  He was given phone number for Central scheduling.

## 2022-03-15 ENCOUNTER — Other Ambulatory Visit: Payer: Self-pay | Admitting: Internal Medicine

## 2022-03-21 ENCOUNTER — Ambulatory Visit (HOSPITAL_COMMUNITY)
Admission: RE | Admit: 2022-03-21 | Discharge: 2022-03-21 | Disposition: A | Discharge status: Home / Self Care | Payer: BC Managed Care – PPO | Source: Ambulatory Visit | Attending: Internal Medicine | Admitting: Internal Medicine

## 2022-03-21 ENCOUNTER — Encounter: Payer: Self-pay | Admitting: Internal Medicine

## 2022-03-21 DIAGNOSIS — E7800 Pure hypercholesterolemia, unspecified: Secondary | ICD-10-CM | POA: Insufficient documentation

## 2022-03-21 MED ORDER — FLUTICASONE FUROATE-VILANTEROL 100-25 MCG/ACT IN AEPB: 1.0000 | INHALATION_SPRAY | Freq: Every day | RESPIRATORY_TRACT | 11 refills | Status: DC

## 2022-03-22 NOTE — Progress Notes (Signed)
LVM that I was putting in Cardiology referral and to call back if he had questions

## 2022-04-16 ENCOUNTER — Other Ambulatory Visit: Payer: Self-pay | Admitting: Internal Medicine

## 2022-05-01 ENCOUNTER — Other Ambulatory Visit: Payer: Self-pay | Admitting: Internal Medicine

## 2022-06-06 ENCOUNTER — Ambulatory Visit (HOSPITAL_BASED_OUTPATIENT_CLINIC_OR_DEPARTMENT_OTHER): Payer: BC Managed Care – PPO | Admitting: Cardiovascular Disease

## 2022-06-06 ENCOUNTER — Encounter (HOSPITAL_BASED_OUTPATIENT_CLINIC_OR_DEPARTMENT_OTHER): Payer: Self-pay | Admitting: Cardiovascular Disease

## 2022-06-06 VITALS — BP 154/94 | HR 67 | Ht 73.5 in | Wt 221.7 lb

## 2022-06-06 DIAGNOSIS — E7800 Pure hypercholesterolemia, unspecified: Secondary | ICD-10-CM

## 2022-06-06 DIAGNOSIS — Z5181 Encounter for therapeutic drug level monitoring: Secondary | ICD-10-CM

## 2022-06-06 DIAGNOSIS — I7121 Aneurysm of the ascending aorta, without rupture: Secondary | ICD-10-CM | POA: Diagnosis not present

## 2022-06-06 DIAGNOSIS — I2510 Atherosclerotic heart disease of native coronary artery without angina pectoris: Secondary | ICD-10-CM

## 2022-06-06 DIAGNOSIS — I10 Essential (primary) hypertension: Secondary | ICD-10-CM

## 2022-06-06 HISTORY — DX: Pure hypercholesterolemia, unspecified: E78.00

## 2022-06-06 HISTORY — DX: Aneurysm of the ascending aorta, without rupture: I71.21

## 2022-06-06 MED ORDER — CARVEDILOL 3.125 MG PO TABS: 3.1250 mg | ORAL_TABLET | Freq: Two times a day (BID) | ORAL | 3 refills | Status: DC

## 2022-06-06 MED ORDER — ATORVASTATIN CALCIUM 10 MG PO TABS: 10.0000 mg | ORAL_TABLET | Freq: Every day | ORAL | 3 refills | Status: DC

## 2022-06-06 NOTE — Assessment & Plan Note (Signed)
LDL goal less than 60.  We will stop rosuvastatin and try atorvastatin 10 mg daily.  Has had myalgias.  If he does not tolerate this or it does not get to goal, consider PCSK9 inhibitors.

## 2022-06-06 NOTE — Patient Instructions (Signed)
Medication Instructions:  START CARVEDILOL 3.125 MG TWICE A DAY   STOP ROSUVASTATIN   START ATORVASTATIN 10 MG DAILY   *If you need a refill on your cardiac medications before your next appointment, please call your pharmacy*  Lab Work: FASTING LP/CMET IN 2-3 MONTHS   If you have labs (blood work) drawn today and your tests are completely normal, you will receive your results only by: [place] (if you have MyChart) OR A paper copy in the mail If you have any lab test that is abnormal or we need to change your treatment, we will call you to review the results.  Testing/Procedures: Your physician has requested that you have an echocardiogram. Echocardiography is a painless test that uses sound waves to create images of your heart. It provides your doctor with information about the size and shape of your heart and how well your heart's chambers and valves are working. This procedure takes approximately one hour. There are no restrictions for this procedure. Please do NOT wear cologne, perfume, aftershave, or lotions (deodorant is allowed). Please arrive 15 minutes prior to your appointment time. IN 6 MONTHS ABOUT 1 WEEK PRIOR TO FOLLOW UP  Follow-Up: At [facility], you and your health needs are our priority.  As part of our continuing mission to provide you with exceptional heart care, we have created designated Provider Care Teams.  These Care Teams include your primary Cardiologist (physician) and Advanced Practice Providers (APPs -  Physician Assistants and Nurse Practitioners) who all work together to provide you with the care you need, when you need it.  We recommend signing up for the patient portal called "MyChart".  Sign up information is provided on this After Visit Summary.  MyChart is used to connect with patients for Virtual Visits (Telemedicine).  Patients are able to view lab/test results, encounter notes, upcoming appointments, etc.  Non-urgent messages can be  sent to your provider as well.   To learn more about what you can do with MyChart, go to [email or web address].    Your next appointment:   6 month(s)  Provider:   Skeet Latch, MD    [address] [place] NP  Other Instructions MONITOR AND LOG YOUR BLOOD PRESSURE TWICE A DAY, LOG. BRING YOUR MACHINE AND LOG TO FOLLOW UP

## 2022-06-06 NOTE — Assessment & Plan Note (Signed)
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  We will get an echocardiogram in 6 months.  Blood pressure management as above.

## 2022-06-06 NOTE — Progress Notes (Signed)
Cardiology Office Note:    Date:  07/12/2022   ID:  Melvin Cushing, PhD, DOB 1955-08-05, MRN [id number]  PCP:  Elby Showers, MD   [facility] Providers Cardiologist:  None     Referring MD: Elby Showers, MD   No chief complaint on file.   History of Present Illness:    Melvin Cushing, PhD is a 67 y.o. male with a hx of CAD, ascending aortic aneurysm, hypertension, hyperlipidemia, asthma, osteoarthritis, anxiety, and basal cell carcinoma, here for the evaluation of elevated coronary calcium score. He was seen by Dr. Renold Genta 01/2022. He was on rosuvastatin 5 mg daily. He had a coronary CT 03/2022 revealing a coronary calcium score of 26.4, which was 34th percentile. Calcifications noted in the left main. Also noted to have an ascending aortic aneurysm up to 46 mm.  Today, he states he is feeling better. He has had some episodes of significant shortness of breath with walking up stairs. This has been associated with chest heaviness in the past; not so much lately. However, he does complain of feeling some chest discomfort/heaviness while lying in bed at night. He exercises rarely. Recently he took a mild-paced walk with using 5 lb arm weights, mostly on level ground. He does complete vigorous yard work sometimes. Occasionally he has LE swelling that usually improves by the next morning, although this morning he still had some swelling. Due to arthritis he complains of bilateral knee pain. Ever since he was started on statin therapy he also experiences LE muscle cramps at night. His blood pressures have been labile. He presents a BP log which shows a period of higher than normal readings in December/January 2023. He switched asthma medications at the time from Symbicort to [facility]. He endorses a history of asthma flare-ups in the late Autumn months. Previously he was infected with COVID in 08/2019, and RSV in 04/2020. Typically he drinks 3 large cups of coffee a day between 6 AM and noon. Most nights he will  drink 1 beer. No smoking history. In his diet he does not cook with salt at all. 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 Previously he exhibited symptoms of cognitive decline. He reports having an MRI that showed no severe abnormalities. He denies any palpitations, lightheadedness, headaches, syncope, orthopnea, or PND.   Past Medical History:  Diagnosis Date   Allergy    Anxiety    2 years ago no longer on meds  Zoloft   Arthritis    osteoarthritis   Ascending aortic aneurysm ([place]) 06/06/2022   Asthma    Basal cell carcinoma    CAD in native artery 07/12/2022   Cancer ([place])    Hyperlipidemia    Hypertension    Memory loss    Pure hypercholesterolemia 06/06/2022   Vision abnormalities    migraines    Past Surgical History:  Procedure Laterality Date   KNEE ARTHROSCOPY W/ MENISCAL REPAIR Right     Current Medications: Current Meds  Medication Sig   acetaminophen (TYLENOL) 500 MG tablet Take 500 mg by mouth as needed.   albuterol (VENTOLIN HFA) 108 (90 Base) MCG/ACT inhaler INHALE 2 PUFFS INTO THE LUNGS EVERY 6 HOURS AS NEEDED FOR WHEEZING   carvedilol (COREG) 3.125 MG tablet Take 1 tablet (3.125 mg total) by mouth 2 (two) times daily.   cetirizine (ZYRTEC) 10 MG tablet Take 10 mg by mouth daily.   cyanocobalamin (,VITAMIN B-12,) 1000 MCG/ML injection INJECT 1ML INTRAMUSCULARLY ONCE A MONTH  cycloSPORINE (RESTASIS) 0.05 % ophthalmic emulsion    doxycycline (ADOXA) 50 MG tablet Take 50 mg by mouth daily.   finasteride (PROSCAR) 5 MG tablet Take 5 mg by mouth daily.   fluticasone (FLONASE) 50 MCG/ACT nasal spray USE 2 SPRAYS INTO EACH NOSTRIL TWICE DAILY   fluticasone furoate-vilanterol (BREO ELLIPTA) 100-25 MCG/ACT AEPB Inhale 1 puff into the lungs daily.   losartan (COZAAR) 100 MG tablet TAKE 1 TABLET BY MOUTH ONCE DAILY   methylPREDNISolone (MEDROL) 4 MG tablet Take in tapering course as directed for bronchospasm: 6 tabs by mouth on day one and decrease by one  tab daily 6-5-4-3-2-1 taper   montelukast (SINGULAIR) 10 MG tablet TAKE 1 TABLET BY MOUTH AT BEDTIME   neomycin-polymyxin-hydrocortisone (CORTISPORIN) OTIC solution Apply one to two drops to toe after soaking twice daily.   potassium chloride SA (KLOR-CON M) 20 MEQ tablet TAKE 1 TABLET BY MOUTH ONCE DAILY   tamsulosin (FLOMAX) 0.4 MG CAPS capsule Take 1 capsule (0.4 mg total) by mouth daily.   [DISCONTINUED] atorvastatin (LIPITOR) 10 MG tablet Take 1 tablet (10 mg total) by mouth daily.   [DISCONTINUED] esomeprazole (NEXIUM) 40 MG capsule TAKE 1 CAPSULE BY MOUTH ONCE DAILY   [DISCONTINUED] rosuvastatin (CRESTOR) 5 MG tablet TAKE 1 TABLET BY MOUTH ONCE DAILY   Current Facility-Administered Medications for the 06/06/22 encounter (Office Visit) with Skeet Latch, MD  Medication   0.9 %  sodium chloride infusion     Allergies:   Patient has no known allergies.   Social History   Socioeconomic History   Marital status: Married    Spouse name: Not on file   Number of children: Not on file   Years of education: Not on file   Highest education level: Not on file  Occupational History   Not on file  Tobacco Use   Smoking status: Never    Passive exposure: Yes   Smokeless tobacco: Never   Tobacco comments:    father smoked cigars in home  Vaping Use   Vaping Use: Never used  Substance and Sexual Activity   Alcohol use: Yes    Alcohol/week: 1.0 standard drink of alcohol    Types: 1 Standard drinks or equivalent per week    Comment: daily wine   Drug use: No   Sexual activity: Not on file  Other Topics Concern   Not on file  Social History Narrative   Not on file   Social Determinants of Health   Financial Resource Strain: Not on file  Food Insecurity: Not on file  Transportation Needs: Not on file  Physical Activity: Not on file  Stress: Not on file  Social Connections: Not on file     Family History: The patient's family history includes Asthma in his sister; Colon  cancer in his maternal grandmother; Dementia in his mother; Diabetes in his father; Heart attack (age of onset: 56) in his father; Heart disease in his father; Hypertension in his brother; Lung cancer in his sister; Mental illness in his mother. There is no history of Colon polyps, Esophageal cancer, Rectal cancer, or Stomach cancer.  ROS:   Please see the history of present illness.    (+) Shortness of breath (+) Chest heaviness (+) Bilateral knee pain (+) LE edema All other systems reviewed and are negative.  EKGs/Labs/Other Studies Reviewed:    The following studies were reviewed today:  CT Cardiac Scoring  03/21/2022: FINDINGS: Coronary Calcium Score:   Left main: 26.4   Left anterior  descending artery: 0   Left circumflex artery: 0   Right coronary artery: 0   Total: 26.4   Percentile: 34th   Pericardium: Normal.   Ascending aorta: Aneurysm up to 46 mm.   Non-cardiac: See separate report from [facility].   IMPRESSION: 1. Coronary calcium score of 26.4. This was 34th percentile for age-, race-, and sex-matched controls.   2. Ascending aortic aneurysm up to 46 mm.  EKG:   EKG is personally reviewed. 06/06/2022: Sinus rhythm. Rate 67 bpm.  Recent Labs: 09/18/2021: Hemoglobin 14.7; Platelets 133 02/05/2022: ALT 27; BUN 16; Creat 1.17; Potassium 4.4; Sodium 140   Recent Lipid Panel    Component Value Date/Time   CHOL 190 02/05/2022 0938   TRIG 137 02/05/2022 0938   HDL 77 02/05/2022 0938   CHOLHDL 2.5 02/05/2022 0938   VLDL 17 10/08/2016 1018   LDLCALC 89 02/05/2022 0938     Physical Exam:    Wt Readings from Last 3 Encounters:  06/06/22 221 lb 11.2 oz (100.6 kg)  02/07/22 212 lb 12.8 oz (96.5 kg)  08/07/21 219 lb (99.3 kg)     VS:  BP (!) 154/94 (BP Location: Left Arm, Patient Position: Sitting, Cuff Size: Large)   Pulse 67   Ht 6' 1.5" (1.867 m)   Wt 221 lb 11.2 oz (100.6 kg)   BMI 28.85 kg/m  , BMI Body mass index is 28.85  kg/m. GENERAL:  Well appearing HEENT: Pupils equal round and reactive, fundi not visualized, oral mucosa unremarkable NECK:  No jugular venous distention, waveform within normal limits, carotid upstroke brisk and symmetric, no bruits, no thyromegaly LUNGS:  Clear to auscultation bilaterally HEART:  RRR.  PMI not displaced or sustained,S1 and S2 within normal limits, no S3, no S4, no clicks, no rubs, no murmurs ABD:  Flat, positive bowel sounds normal in frequency in pitch, no bruits, no rebound, no guarding, no midline pulsatile mass, no hepatomegaly, no splenomegaly EXT:  2 plus pulses throughout, no edema, no cyanosis no clubbing SKIN:  No rashes no nodules NEURO:  Cranial nerves II through XII grossly intact, motor grossly intact throughout PSYCH:  Cognitively intact, oriented to person place and time   ASSESSMENT:    1. Essential hypertension, benign   2. Aneurysm of ascending aorta without rupture (HCC)   3. Pure hypercholesterolemia   4. Therapeutic drug monitoring   5. CAD in native artery    PLAN:    Essential hypertension, benign Dr. Romilda Garret brought documentation of his home blood pressures which have been very labile.  It was especially elevated when he was dealing with more work stress.  Lately it has been better controlled but still above goal.  He has an ascending aortic aneurysm of 4.6 cm.  We need to have very aggressive blood pressure control.  Will start carvedilol 3.125 mg twice daily.  His blood pressure dropped significantly when he was on HCTZ.  Recommend continuing losartan.  If blood pressures remain above goal we could stop his potassium and add spironolactone.  Could also switch to a more potent ARB.  He will follow-up with our hypertension team in 1 month and check his blood pressures twice a day until that time.  Ascending aortic aneurysm (HCC) Ascending aorta 4.6 cm.  We will get an echocardiogram in 6 months.  Blood pressure management as above.  Pure  hypercholesterolemia LDL goal less than 60.  We will stop rosuvastatin and try atorvastatin 10 mg daily.  Has had myalgias.  If he does not tolerate this or it does not get to goal, consider PCSK9 inhibitors.  CAD in native artery Noted on coronary calcium score.  Score 26.4, 34th percentile 03/2022.  He has occasional shortness of breath and atypical chest pain that does not seem to be ischemic.  No further evaluation indicated at this time.  He has myalgias on rosuvastatin.  He will hold rosuvastatin for two week.  If symptoms improve, start atorvastatin 10 mg.  Repeat lipids/CMP in 2-3 months.  Start carvedilol as above.   Disposition: FU with Emmersen Garraway C. Oval Linsey, MD, [facility] in 6 months.  Medication Adjustments/Labs and Tests Ordered: Current medicines are reviewed at length with the patient today.  Concerns regarding medicines are outlined above.   Orders Placed This Encounter  Procedures   Lipid panel   Comprehensive metabolic panel   EKG [id number]   ECHOCARDIOGRAM COMPLETE   Meds ordered this encounter  Medications   carvedilol (COREG) 3.125 MG tablet    Sig: Take 1 tablet (3.125 mg total) by mouth 2 (two) times daily.    Dispense:  180 tablet    Refill:  3   DISCONTD: atorvastatin (LIPITOR) 10 MG tablet    Sig: Take 1 tablet (10 mg total) by mouth daily.    Dispense:  90 tablet    Refill:  3   Patient Instructions  Medication Instructions:  START CARVEDILOL 3.125 MG TWICE A DAY   STOP ROSUVASTATIN   START ATORVASTATIN 10 MG DAILY   *If you need a refill on your cardiac medications before your next appointment, please call your pharmacy*  Lab Work: FASTING LP/CMET IN 2-3 MONTHS   If you have labs (blood work) drawn today and your tests are completely normal, you will receive your results only by: [place] (if you have MyChart) OR A paper copy in the mail If you have any lab test that is abnormal or we need to change your treatment, we will call you to review  the results.  Testing/Procedures: Your physician has requested that you have an echocardiogram. Echocardiography is a painless test that uses sound waves to create images of your heart. It provides your doctor with information about the size and shape of your heart and how well your heart's chambers and valves are working. This procedure takes approximately one hour. There are no restrictions for this procedure. Please do NOT wear cologne, perfume, aftershave, or lotions (deodorant is allowed). Please arrive 15 minutes prior to your appointment time. IN 6 MONTHS ABOUT 1 WEEK PRIOR TO FOLLOW UP  Follow-Up: At [facility], you and your health needs are our priority.  As part of our continuing mission to provide you with exceptional heart care, we have created designated Provider Care Teams.  These Care Teams include your primary Cardiologist (physician) and Advanced Practice Providers (APPs -  Physician Assistants and Nurse Practitioners) who all work together to provide you with the care you need, when you need it.  We recommend signing up for the patient portal called "MyChart".  Sign up information is provided on this After Visit Summary.  MyChart is used to connect with patients for Virtual Visits (Telemedicine).  Patients are able to view lab/test results, encounter notes, upcoming appointments, etc.  Non-urgent messages can be sent to your provider as well.   To learn more about what you can do with MyChart, go to [email or web address].    Your next appointment:   6 month(s)  Provider:   Skeet Latch, MD  1 MONTH WITH Winfred Burn NP  Other Instructions MONITOR AND LOG YOUR BLOOD PRESSURE TWICE A DAY, LOG. BRING YOUR MACHINE AND LOG TO FOLLOW UP     I,Mathew Stumpf,acting as a scribe for Skeet Latch, MD.,have documented all relevant documentation on the behalf of Skeet Latch, MD,as directed by  Skeet Latch, MD while in the presence of Skeet Latch,  MD.  I, [place] Oval Linsey, MD have reviewed all documentation for this visit.  The documentation of the exam, diagnosis, procedures, and orders on 07/12/2022 are all accurate and complete.   Signed, Skeet Latch, MD  07/12/2022 1:21 PM    [facility]

## 2022-06-06 NOTE — Assessment & Plan Note (Addendum)
Dr. Romilda Garret brought documentation of his home blood pressures which have been very labile.  It was especially elevated when he was dealing with more work stress.  Lately it has been better controlled but still above goal.  He has an ascending aortic aneurysm of 4.6 cm.  We need to have very aggressive blood pressure control.  Will start carvedilol 3.125 mg twice daily.  His blood pressure dropped significantly when he was on HCTZ.  Recommend continuing losartan.  If blood pressures remain above goal we could stop his potassium and add spironolactone.  Could also switch to a more potent ARB.  He will follow-up with our hypertension team in 1 month and check his blood pressures twice a day until that time.

## 2022-06-21 ENCOUNTER — Telehealth: Payer: Self-pay | Admitting: Cardiovascular Disease

## 2022-06-21 MED ORDER — ATORVASTATIN CALCIUM 10 MG PO TABS: 10.0000 mg | ORAL_TABLET | Freq: Every day | ORAL | 3 refills | Status: DC

## 2022-06-21 NOTE — Telephone Encounter (Signed)
Pt's medication was sent to pt's pharmacy as requested. Confirmation received.  °

## 2022-06-21 NOTE — Telephone Encounter (Signed)
*  STAT* If patient is at the pharmacy, call can be transferred to refill team.   1. Which medications need to be refilled? (please list name of each medication and dose if known) atorvastatin (LIPITOR) 10 MG tablet   2. Which pharmacy/location (including street and city if local pharmacy) is medication to be sent to?  [place] they need a 30 day or 90 day supply? 90 day

## 2022-06-25 ENCOUNTER — Other Ambulatory Visit: Payer: Self-pay | Admitting: Internal Medicine

## 2022-07-12 ENCOUNTER — Encounter (HOSPITAL_BASED_OUTPATIENT_CLINIC_OR_DEPARTMENT_OTHER): Payer: Self-pay | Admitting: Cardiovascular Disease

## 2022-07-12 DIAGNOSIS — I2510 Atherosclerotic heart disease of native coronary artery without angina pectoris: Secondary | ICD-10-CM

## 2022-07-12 HISTORY — DX: Atherosclerotic heart disease of native coronary artery without angina pectoris: I25.10

## 2022-07-12 NOTE — Assessment & Plan Note (Signed)
Noted on coronary calcium score.  Score 26.4, 34th percentile 03/2022.  He has occasional shortness of breath and atypical chest pain that does not seem to be ischemic.  No further evaluation indicated at this time.  He has myalgias on rosuvastatin.  He will hold rosuvastatin for two week.  If symptoms improve, start atorvastatin 10 mg.  Repeat lipids/CMP in 2-3 months.  Start carvedilol as above.

## 2022-07-24 ENCOUNTER — Encounter (HOSPITAL_BASED_OUTPATIENT_CLINIC_OR_DEPARTMENT_OTHER): Payer: Self-pay | Admitting: Cardiovascular Disease

## 2022-08-02 ENCOUNTER — Ambulatory Visit (INDEPENDENT_AMBULATORY_CARE_PROVIDER_SITE_OTHER): Payer: BC Managed Care – PPO | Admitting: Family

## 2022-08-02 ENCOUNTER — Encounter (HOSPITAL_BASED_OUTPATIENT_CLINIC_OR_DEPARTMENT_OTHER): Payer: Self-pay | Admitting: Family

## 2022-08-02 VITALS — BP 148/98 | HR 63 | Ht 73.5 in | Wt 220.0 lb

## 2022-08-02 DIAGNOSIS — E785 Hyperlipidemia, unspecified: Secondary | ICD-10-CM

## 2022-08-02 DIAGNOSIS — I2510 Atherosclerotic heart disease of native coronary artery without angina pectoris: Secondary | ICD-10-CM | POA: Diagnosis not present

## 2022-08-02 DIAGNOSIS — I10 Essential (primary) hypertension: Secondary | ICD-10-CM

## 2022-08-02 DIAGNOSIS — I7121 Aneurysm of the ascending aorta, without rupture: Secondary | ICD-10-CM

## 2022-08-02 MED ORDER — OLMESARTAN MEDOXOMIL 40 MG PO TABS
40.0000 mg | ORAL_TABLET | Freq: Every day | ORAL | 1 refills | Status: DC
Start: 2022-08-02 — End: 2022-10-31

## 2022-08-02 NOTE — Progress Notes (Unsigned)
Office Visit    Patient Name: Melvin Devoid, Melvin Dixon Date of Encounter: 08/07/2022  PCP:  Margaree Mackintosh, MD   [facility]  Cardiologist:  Chilton Si, MD  Advanced Practice Provider:  No care team member to display Electrophysiologist:  None      Chief Complaint    Melvin Devoid, Melvin Dixon is a 67 y.o. male presents today for hypertension   Past Medical History    Past Medical History:  Diagnosis Date   Allergy    Anxiety    2 years ago no longer on meds  Zoloft   Arthritis    osteoarthritis   Ascending aortic aneurysm 06/06/2022   Asthma    Basal cell carcinoma    CAD in native artery 07/12/2022   Cancer    Hyperlipidemia    Hypertension    Memory loss    Pure hypercholesterolemia 06/06/2022   Vision abnormalities    migraines   Past Surgical History:  Procedure Laterality Date   KNEE ARTHROSCOPY W/ MENISCAL REPAIR Right     Allergies  No Known Allergies  History of Present Illness    Melvin Devoid, Melvin Dixon is a 67 y.o. male with a hx of CAD, ascending aortic aneurysm, HTN, HLD, asthma, OA, anxiety, basal cell carcinoma last seen 06/06/22.  Coronary CT 03/2022 coronary calcium score of 26.4 which was in 34th percentile with calcifications noted to left main. Also ascending aortic aneurysm up to 46mm.  At visit 06/06/22 Carvedilol 3.125mg  BID was initiated. It was noted BP had previously dropped significantly on HCTZ. Losartan was continued. Rosuvastatin was stopped and transitioned to Atorvastatin due to myalgias.   Presents today for follow up regarding his blood pressure. 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 Notes he is taking carvedilol around 7 AM and 5-7 PM. Losartan in the evening. BP not consistently at goal <130/80. Since transition to Atorvastatin leg cramps have reduced.   Reports no shortness of breath nor dyspnea on exertion. Reports no chest pain, pressure, or tightness. No edema, orthopnea, PND.  Reports no palpitations.    EKGs/Labs/Other Studies Reviewed:   The following studies were reviewed today:  Cardiac Studies & Procedures          CT SCANS  CT CARDIAC SCORING (SELF PAY ONLY) 03/22/2022  Addendum 03/22/2022  6:25 AM ADDENDUM REPORT: 03/22/2022 06:23  CLINICAL DATA:  Cardiovascular Disease Risk stratification  EXAM: Coronary Calcium Score  TECHNIQUE: A gated, non-contrast computed tomography scan of the heart was performed using 3mm slice thickness. Axial images were analyzed on a dedicated workstation. Calcium scoring of the coronary arteries was performed using the Agatston method.  FINDINGS: Coronary Calcium Score:  Left main: 26.4  Left anterior descending artery: 0  Left circumflex artery: 0  Right coronary artery: 0  Total: 26.4  Percentile: 34th  Pericardium: Normal.  Ascending aorta: Aneurysm up to 46 mm.  Non-cardiac: See separate report from [facility].  IMPRESSION: 1. Coronary calcium score of 26.4. This was 34th percentile for age-, race-, and sex-matched controls.  2. Ascending aortic aneurysm up to 46 mm.  RECOMMENDATIONS: Coronary artery calcium (CAC) score is a strong predictor of incident coronary heart disease (CHD) and provides predictive information beyond traditional risk factors. CAC scoring is reasonable to use in the decision to withhold, postpone, or initiate statin therapy in intermediate-risk or selected borderline-risk asymptomatic adults (age 60-75 years and LDL-C >=70 to <190 mg/dL) who do not have diabetes or  established atherosclerotic cardiovascular disease (ASCVD).* In intermediate-risk (10-year ASCVD risk >=7.5% to <20%) adults or selected borderline-risk (10-year ASCVD risk >=5% to <7.5%) adults in whom a CAC score is measured for the purpose of making a treatment decision the following recommendations have been made:  If CAC=0, it is reasonable to withhold statin therapy and reassess in 5  to 10 years, as long as higher risk conditions are absent (diabetes mellitus, family history of premature CHD in first degree relatives (males <55 years; females <65 years), cigarette smoking, or LDL >=190 mg/dL).  If CAC is 1 to 99, it is reasonable to initiate statin therapy for patients >=80 years of age.  If CAC is >=100 or >=75th percentile, it is reasonable to initiate statin therapy at any age.  Cardiology referral should be considered for patients with CAC scores >=400 or >=75th percentile.  *2018 AHA/ACC/AACVPR/AAPA/ABC/ACPM/ADA/AGS/APhA/ASPC/NLA/PCNA Guideline on the Management of Blood Cholesterol: A Report of the American College of Cardiology/American Heart Association Task Force on Clinical Practice Guidelines. J Am Coll Cardiol. 2019;73(24):3168-3209.  Lennie Odor, MD   Electronically Signed By: Lennie Odor M.D. On: 03/22/2022 06:23  Narrative EXAM: OVER-READ INTERPRETATION  CT CHEST  The following report is a limited chest CT over-read performed by radiologist Dr. Signa Kell of [facility] on 03/21/2022. This over-read does not include interpretation of cardiac or coronary anatomy or pathology. The calcium score CT interpretation by the cardiologist is attached.  COMPARISON:  None Available.  FINDINGS: Vascular: No acute abnormality.  Mediastinum/Nodes: No mediastinal mass or adenopathy.  Lungs/Pleura: No pleural fluid. No airspace disease. No suspicious pulmonary nodule or mass.  Upper Abdomen: No acute abnormality. Cyst within left lobe of liver measures 1.5 cm.  Musculoskeletal: No acute findings. Mild degenerative disc disease identified within the lower thoracic spine.  IMPRESSION: No acute or significant extracardiac findings.  Electronically Signed: By: Signa Kell M.D. On: 03/21/2022 11:47           EKG:  EKG is not ordered today  Recent Labs: 09/18/2021: Hemoglobin 14.7; Platelets 133 02/05/2022: ALT 27;  BUN 16; Creat 1.17; Potassium 4.4; Sodium 140  Recent Lipid Panel    Component Value Date/Time   CHOL 190 02/05/2022 0938   TRIG 137 02/05/2022 0938   HDL 77 02/05/2022 0938   CHOLHDL 2.5 02/05/2022 0938   VLDL 17 10/08/2016 1018   LDLCALC 89 02/05/2022 0938     Home Medications   Current Meds  Medication Sig   acetaminophen (TYLENOL) 500 MG tablet Take 500 mg by mouth as needed.   albuterol (VENTOLIN HFA) 108 (90 Base) MCG/ACT inhaler INHALE 2 PUFFS INTO THE LUNGS EVERY 6 HOURS AS NEEDED FOR WHEEZING   atorvastatin (LIPITOR) 10 MG tablet Take 1 tablet (10 mg total) by mouth daily.   azithromycin (ZITHROMAX Z-PAK) 250 MG tablet Take 2 tablets (500 mg) on  Day 1,  followed by 1 tablet (250 mg) once daily on Days 2 through 5.   benzonatate (TESSALON) 100 MG capsule Take 1 capsule (100 mg total) by mouth 3 (three) times daily as needed for cough.   carvedilol (COREG) 3.125 MG tablet Take 1 tablet (3.125 mg total) by mouth 2 (two) times daily.   cetirizine (ZYRTEC) 10 MG tablet Take 10 mg by mouth daily.   cyanocobalamin (,VITAMIN B-12,) 1000 MCG/ML injection INJECT INTRAMUSCULARLY ONCE A MONTH   cycloSPORINE (RESTASIS) 0.05 % ophthalmic emulsion    doxycycline (ADOXA) 50 MG tablet Take 50 mg by mouth daily.  esomeprazole (NEXIUM) 40 MG capsule TAKE 1 CAPSULE BY MOUTH ONCE DAILY   finasteride (PROSCAR) 5 MG tablet Take 5 mg by mouth daily.   fluticasone (FLONASE) 50 MCG/ACT nasal spray USE 2 SPRAYS INTO EACH NOSTRIL TWICE DAILY   fluticasone furoate-vilanterol (BREO ELLIPTA) 100-25 MCG/ACT AEPB Inhale 1 puff into the lungs daily.   HYDROcodone bit-homatropine (HYCODAN) 5-1.5 MG/5ML syrup Take 5 mLs by mouth every 8 (eight) hours as needed for cough.   methylPREDNISolone (MEDROL) 4 MG tablet Take in tapering course as directed for bronchospasm: 6 tabs by mouth on day one and decrease by one tab daily 6-5-4-3-2-1 taper   montelukast (SINGULAIR) 10 MG tablet TAKE 1 TABLET BY MOUTH AT  BEDTIME   neomycin-polymyxin-hydrocortisone (CORTISPORIN) OTIC solution Apply one to two drops to toe after soaking twice daily.   olmesartan (BENICAR) 40 MG tablet Take 1 tablet (40 mg total) by mouth daily.   potassium chloride SA (KLOR-CON M) 20 MEQ tablet TAKE 1 TABLET BY MOUTH ONCE DAILY   tamsulosin (FLOMAX) 0.4 MG CAPS capsule Take 1 capsule (0.4 mg total) by mouth daily.   [DISCONTINUED] losartan (COZAAR) 100 MG tablet TAKE 1 TABLET BY MOUTH ONCE DAILY   Current Facility-Administered Medications for the 08/02/22 encounter (Office Visit) with Alver Sorrow, NP  Medication   0.9 %  sodium chloride infusion     Review of Systems      All other systems reviewed and are otherwise negative except as noted above.  Physical Exam    VS:  BP (!) 148/98   Pulse 63   Ht 6' 1.5" (1.867 m)   Wt 220 lb (99.8 kg)   BMI 28.63 kg/m  , BMI Body mass index is 28.63 kg/m.  Wt Readings from Last 3 Encounters:  08/02/22 220 lb (99.8 kg)  06/06/22 221 lb 11.2 oz (100.6 kg)  02/07/22 212 lb 12.8 oz (96.5 kg)     GEN: Well nourished, well developed, in no acute distress. HEENT: normal. Neck: Supple, no JVD, carotid bruits, or masses. Cardiac: RRR, no murmurs, rubs, or gallops. No clubbing, cyanosis, edema.  Radials/PT 2+ and equal bilaterally.  Respiratory:  Respirations regular and unlabored, clear to auscultation bilaterally. GI: Soft, nontender, nondistended. MS: No deformity or atrophy. Skin: Warm and dry, no rash. Neuro:  Strength and sensation are intact. Psych: Normal affect.  Assessment & Plan    Nonobstructive CAD - Stable with no anginal symptoms. No indication for ischemic evaluation.  GDMT Atorvastatin, Carvedilol.Heart healthy diet and regular cardiovascular exercise encouraged.    HLD, LDL goal <70 - Prior intolerance to Rosuvastatin. Tolerating Atorvastatin. Will request PCP update CMP, lipid panel at upcoming labs. If LDL not at goal, consider increasing dose vs  addition of Zetia.   HTN - BP not at goal <130/80. Stop Losartan, start Olmesartan 40mg  QD. Will request PCP collect CMP at upcoming lab appt. If BP not at goal <130/80, could consider addition of low dose Spironolactone and discontinuation of potassium supplement.   Ascending aortic aneurysm - CT 03/21/22 up to 46mm. Continue optimal BP control as above and beta blocker. Avoid heavy lifting, fluoroquinolones. Echo already scheduled for 11/2022 for monitoring.      Disposition: Follow up  as scheduled  with Chilton Si, MD or APP.  Signed, Alver Sorrow, NP 08/07/2022, 3:13 PM [facility]

## 2022-08-02 NOTE — Patient Instructions (Signed)
Medication Instructions:  Your physician has recommended you make the following change in your medication:   Stop: Losartan   Start: Olmesartan  daily  *If you need a refill on your cardiac medications before your next appointment, please call your pharmacy*   Lab Work: Labs with PCP   Follow-Up: At [facility], you and your health needs are our priority.  As part of our continuing mission to provide you with exceptional heart care, we have created designated Provider Care Teams.  These Care Teams include your primary Cardiologist (physician) and Advanced Practice Providers (APPs -  Physician Assistants and Nurse Practitioners) who all work together to provide you with the care you need, when you need it.  We recommend signing up for the patient portal called "MyChart".  Sign up information is provided on this After Visit Summary.  MyChart is used to connect with patients for Virtual Visits (Telemedicine).  Patients are able to view lab/test results, encounter notes, upcoming appointments, etc.  Non-urgent messages can be sent to your provider as well.   To learn more about what you can do with MyChart, go to [email or web address].    Your next appointment:   Follow up as scheduled

## 2022-08-06 ENCOUNTER — Other Ambulatory Visit: Payer: BC Managed Care – PPO

## 2022-08-08 ENCOUNTER — Ambulatory Visit: Payer: BC Managed Care – PPO | Admitting: Internal Medicine

## 2022-08-12 NOTE — Progress Notes (Shared)
Patient Care Team: Margaree Mackintosh, MD as PCP - General (Internal Medicine) Chilton Si, MD as PCP - Cardiology (Cardiology)  Visit Date: 08/12/22  Subjective:    Patient ID: Melvin Devoid, PhD , Male   DOB: March 06, 1956, 67 y.o.    MRN: [id number]   67 y.o. Male presents today for a 6 month follow-up. Patient has a past medical history of allergies, anxiety, arthritis, ascending aortic aneurysm 2024. Asthma, basal cell carcinoma, coronary artery disease native artery 2024, cancer, hyperlipidemia, hypertension, memory loss, migraine headaches.  History of hyperlipidemia treated with atorvastatin 10 mg daily.  History of coronary artery disease, ascending aortic aneurysm 2024. He is currently taking carvedilol 3.125 mg twice daily, olmesartan 40 mg daily, Klor-Con 20 meq daily. Followed by Dr. Chilton Si, cardiologist.  History of GERD treated with esomeprazole 40 mg daily.  History of asthma treated with montelukast 10 mg daiy at bedtime, Breo Ellipta inhaler one puff daily.   Past Medical History:  Diagnosis Date   Allergy    Anxiety    2 years ago no longer on meds  Zoloft   Arthritis    osteoarthritis   Ascending aortic aneurysm (HCC) 06/06/2022   Asthma    Basal cell carcinoma    CAD in native artery 07/12/2022   Cancer (HCC)    Hyperlipidemia    Hypertension    Memory loss    Pure hypercholesterolemia 06/06/2022   Vision abnormalities    migraines     Family History  Problem Relation Age of Onset   Mental illness Mother    Dementia Mother    Heart attack Father 74   Heart disease Father    Diabetes Father    Asthma Sister    Lung cancer Sister    Hypertension Brother    Colon cancer Maternal Grandmother    Colon polyps Neg Hx    Esophageal cancer Neg Hx    Rectal cancer Neg Hx    Stomach cancer Neg Hx     Social History   Social History Narrative   Not on file      ROS      Objective:   Vitals: There were no vitals taken for this  visit.   Physical Exam    Results:   Studies obtained and personally reviewed by me:  Imaging, colonoscopy, mammogram, bone density scan, echocardiogram, heart cath, stress test, CT calcium score, etc. ***   Labs:       Component Value Date/Time   NA 140 02/05/2022 0938   K 4.4 02/05/2022 0938   CL 102 02/05/2022 0938   CO2 30 02/05/2022 0938   GLUCOSE 91 02/05/2022 0938   BUN 16 02/05/2022 0938   CREATININE 1.17 02/05/2022 0938   CALCIUM 9.5 02/05/2022 0938   PROT 6.7 02/05/2022 0938   ALBUMIN 4.4 02/09/2017 1747   AST 18 02/05/2022 0938   ALT 27 02/05/2022 0938   ALKPHOS 43 02/09/2017 1747   BILITOT 1.0 02/05/2022 0938   GFRNONAA 60 07/27/2020 0948   GFRAA 70 07/27/2020 0948     Lab Results  Component Value Date   WBC 5.3 09/18/2021   HGB 14.7 09/18/2021   HCT 42.9 09/18/2021   MCV 98.8 09/18/2021   PLT 133 (L) 09/18/2021    Lab Results  Component Value Date   CHOL 190 02/05/2022   HDL 77 02/05/2022   LDLCALC 89 02/05/2022   TRIG 137 02/05/2022   CHOLHDL 2.5 02/05/2022    Lab Results  Component Value Date   HGBA1C 5.1 03/29/2016     Lab Results  Component Value Date   TSH 1.230 10/04/2014     Lab Results  Component Value Date   PSA 0.62 08/02/2021   PSA 0.85 07/27/2020   PSA 1.6 07/27/2019   *** delete for male pts  ***    Assessment & Plan:   ***    I,[name],acting as a scribe for Margaree Mackintosh, MD.,have documented all relevant documentation on the behalf of Margaree Mackintosh, MD,as directed by  Margaree Mackintosh, MD while in the presence of Margaree Mackintosh, MD.   ***

## 2022-08-13 ENCOUNTER — Other Ambulatory Visit: Payer: BC Managed Care – PPO

## 2022-08-13 DIAGNOSIS — I10 Essential (primary) hypertension: Secondary | ICD-10-CM

## 2022-08-13 DIAGNOSIS — E7800 Pure hypercholesterolemia, unspecified: Secondary | ICD-10-CM

## 2022-08-13 DIAGNOSIS — E785 Hyperlipidemia, unspecified: Secondary | ICD-10-CM

## 2022-08-13 LAB — CBC WITH DIFFERENTIAL/PLATELET
Absolute Monocytes: 524 cells/uL (ref 200–950)
Basophils Absolute: 20 cells/uL (ref 0–200)
Basophils Relative: 0.5 %
Eosinophils Absolute: 168 cells/uL (ref 15–500)
HCT: 42.4 % (ref 38.5–50.0)
Hemoglobin: 14.7 g/dL (ref 13.2–17.1)
MCH: 34.1 pg — ABNORMAL HIGH (ref 27.0–33.0)
Monocytes Relative: 13.1 %
RBC: 4.31 10*6/uL (ref 4.20–5.80)
WBC: 4 10*3/uL (ref 3.8–10.8)

## 2022-08-13 NOTE — Addendum Note (Signed)
Addended by: Mary Sella D on: 08/13/2022 09:23 AM   Modules accepted: Orders

## 2022-08-14 LAB — COMPLETE METABOLIC PANEL WITH GFR
AG Ratio: 2.1 (calc) (ref 1.0–2.5)
ALT: 19 U/L (ref 9–46)
AST: 16 U/L (ref 10–35)
Albumin: 4.4 g/dL (ref 3.6–5.1)
Alkaline phosphatase (APISO): 61 U/L (ref 35–144)
BUN: 17 mg/dL (ref 7–25)
CO2: 29 mmol/L (ref 20–32)
Calcium: 9.4 mg/dL (ref 8.6–10.3)
Chloride: 104 mmol/L (ref 98–110)
Creat: 1.2 mg/dL (ref 0.70–1.35)
Globulin: 2.1 g/dL (calc) (ref 1.9–3.7)
Glucose, Bld: 99 mg/dL (ref 65–99)
Potassium: 4.7 mmol/L (ref 3.5–5.3)
Sodium: 139 mmol/L (ref 135–146)
Total Bilirubin: 0.9 mg/dL (ref 0.2–1.2)
Total Protein: 6.5 g/dL (ref 6.1–8.1)
eGFR: 66 mL/min/{1.73_m2} (ref >=60)

## 2022-08-14 LAB — HEPATIC FUNCTION PANEL
AG Ratio: 2.1 (calc) (ref 1.0–2.5)
ALT: 19 U/L (ref 9–46)
AST: 16 U/L (ref 10–35)
Albumin: 4.4 g/dL (ref 3.6–5.1)
Alkaline phosphatase (APISO): 61 U/L (ref 35–144)
Bilirubin, Direct: 0.2 mg/dL (ref 0.0–0.2)
Globulin: 2.1 g/dL (calc) (ref 1.9–3.7)
Indirect Bilirubin: 0.7 mg/dL (calc) (ref 0.2–1.2)
Total Bilirubin: 0.9 mg/dL (ref 0.2–1.2)
Total Protein: 6.5 g/dL (ref 6.1–8.1)

## 2022-08-14 LAB — LIPID PANEL
Cholesterol: 178 mg/dL (ref <200)
HDL: 59 mg/dL (ref >=40)
LDL Cholesterol (Calc): 100 mg/dL (calc) — ABNORMAL HIGH
Non-HDL Cholesterol (Calc): 119 mg/dL (calc) (ref <130)
Total CHOL/HDL Ratio: 3 (calc) (ref <5.0)
Triglycerides: 91 mg/dL (ref <150)

## 2022-08-14 LAB — CBC WITH DIFFERENTIAL/PLATELET
Eosinophils Relative: 4.2 %
Lymphs Abs: 1200 cells/uL (ref 850–3900)
MCHC: 34.7 g/dL (ref 32.0–36.0)
MCV: 98.4 fL (ref 80.0–100.0)
MPV: 10.9 fL (ref 7.5–12.5)
Neutro Abs: 2088 cells/uL (ref 1500–7800)
Neutrophils Relative %: 52.2 %
Platelets: 115 10*3/uL — ABNORMAL LOW (ref 140–400)
RDW: 13.4 % (ref 11.0–15.0)
Total Lymphocyte: 30 %

## 2022-08-19 ENCOUNTER — Ambulatory Visit: Payer: BC Managed Care – PPO | Admitting: Internal Medicine

## 2022-08-19 ENCOUNTER — Ambulatory Visit (INDEPENDENT_AMBULATORY_CARE_PROVIDER_SITE_OTHER): Payer: BC Managed Care – PPO | Admitting: Internal Medicine

## 2022-08-19 ENCOUNTER — Encounter: Payer: Self-pay | Admitting: Internal Medicine

## 2022-08-19 VITALS — BP 124/78 | HR 60 | Temp 98.7°F | Ht 73.5 in | Wt 217.8 lb

## 2022-08-19 DIAGNOSIS — N486 Induration penis plastica: Secondary | ICD-10-CM

## 2022-08-19 DIAGNOSIS — K219 Gastro-esophageal reflux disease without esophagitis: Secondary | ICD-10-CM

## 2022-08-19 DIAGNOSIS — N401 Enlarged prostate with lower urinary tract symptoms: Secondary | ICD-10-CM | POA: Diagnosis not present

## 2022-08-19 DIAGNOSIS — Z8709 Personal history of other diseases of the respiratory system: Secondary | ICD-10-CM | POA: Diagnosis not present

## 2022-08-19 DIAGNOSIS — E7800 Pure hypercholesterolemia, unspecified: Secondary | ICD-10-CM | POA: Diagnosis not present

## 2022-08-19 DIAGNOSIS — I10 Essential (primary) hypertension: Secondary | ICD-10-CM | POA: Diagnosis not present

## 2022-08-19 DIAGNOSIS — J3089 Other allergic rhinitis: Secondary | ICD-10-CM

## 2022-08-19 DIAGNOSIS — Z0000 Encounter for general adult medical examination without abnormal findings: Secondary | ICD-10-CM | POA: Diagnosis not present

## 2022-08-19 DIAGNOSIS — R351 Nocturia: Secondary | ICD-10-CM

## 2022-08-19 DIAGNOSIS — E538 Deficiency of other specified B group vitamins: Secondary | ICD-10-CM

## 2022-08-19 LAB — POCT URINALYSIS DIPSTICK
Bilirubin, UA: NEGATIVE
Blood, UA: NEGATIVE
Glucose, UA: NEGATIVE
Ketones, UA: NEGATIVE
Leukocytes, UA: NEGATIVE
Nitrite, UA: NEGATIVE
Protein, UA: NEGATIVE
Spec Grav, UA: 1.01 (ref 1.010–1.025)
Urobilinogen, UA: 0.2 E.U./dL
pH, UA: 5 (ref 5.0–8.0)

## 2022-08-19 NOTE — Patient Instructions (Addendum)
Have Tetanus  vaccine updated at pharmacy. Continue same meds. It was a pleasure to see you today. RTC in one year or as needed.

## 2022-08-19 NOTE — Progress Notes (Signed)
     Subjective:    Patient ID: Melvin Devoid, PhD, male    DOB: 1956/02/04, 67 y.o.   MRN: [id number]  HPI  67 year old  Male seen for health maintenance exam and evaluation of medical issues.  He is feeling well.  BPH seems to be under good control with Flomax and Proscar.  History of upper lid blepharoplasty at [facility] in February 2021.  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  He wears corrective lenses.  History of cataracts.  History of posterior vitreous detachment left eye in January 2023.  This has improved he says.  History of B12 deficiency and gives himself monthly B12 injections.  History of hypertension, allergic rhinitis and asthma.  Had colonoscopy in 2018 and repeat study due 2028.  Was diagnosed in the past with dysphonia.  This has improved.  He does take Nexium 40 mg daily.  History of asthma treated with Symbicort.  Social history: He is married.  Wife has her own consulting business.  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  He has 2 daughters from a previous marriage.  He lives in [place].  Non-smoker.  Social alcohol consumption but not very often.  History of Peyronie's disease seen at [facility]-just being observed and no treatment prescribed.  In 2018 he had an attack of severe vertigo.  He had low potassium at 3.3.  He was evaluated in the emergency department and given potassium supplement during the evaluation and has not had recurrence of vertigo.  History of COVID-19 in the Spring of 2022.  History of right lower lobe pneumonia 1994.  Prostatitis March 2000, vasectomy 1993.  In May 2013 Dr. Londell Moh removed basal cell carcinoma from right sideburn area.  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  In January 2023 he had a protracted respiratory infection and respiratory virus panel was positive for both Adenovirus B and respiratory syncytial  virus A.  Has been seen at [facility] for memory loss in the remote past.  He was tried on Zoloft by Dr. Alois Cliche for possible depression without much improvement.  He later found that insurance did not cover visits there.  Family history: History of dementia in his mother.  Father died at age 41 after several heart attacks.  1 brother in good health.  6 sisters.  Both parents had diabetes.  Father had hypertension.  1 sibling with seizure disorder.  1 sister died of lung cancer with history of smoking.  Has received immunotherapy for allergic rhinitis prior to the pandemic but not recently and does not seem to be bothered by allergies much at the present time.  Has seen Dr. Kendrick Fries in the past regarding mild persistent asthma in 2018.  In 2018 when being evaluated for vertigo in the emergency department EKG showed possible intraventricular conduction delay.  His potassium was slightly low at 3.3 and we subsequently compared his EKG at that time with his EKG from 2009 and his paper record and there were similar findings.    Review of Systems     Objective:   Physical Exam        Assessment & Plan:

## 2022-08-19 NOTE — Telephone Encounter (Signed)
Spoke with patient  Breathing and swelling same as when he saw Dr Duke Salvia and Luther Parody  Does not feel urgent, ok to wait until Dr Duke Salvia and Luther Parody return next week

## 2022-09-13 ENCOUNTER — Encounter (HOSPITAL_BASED_OUTPATIENT_CLINIC_OR_DEPARTMENT_OTHER): Payer: Self-pay | Admitting: Family

## 2022-09-13 ENCOUNTER — Other Ambulatory Visit (HOSPITAL_BASED_OUTPATIENT_CLINIC_OR_DEPARTMENT_OTHER): Payer: Self-pay

## 2022-09-13 ENCOUNTER — Ambulatory Visit (HOSPITAL_BASED_OUTPATIENT_CLINIC_OR_DEPARTMENT_OTHER): Payer: BC Managed Care – PPO | Admitting: Family

## 2022-09-13 VITALS — BP 126/82 | HR 70 | Ht 73.5 in | Wt 218.0 lb

## 2022-09-13 DIAGNOSIS — I25118 Atherosclerotic heart disease of native coronary artery with other forms of angina pectoris: Secondary | ICD-10-CM | POA: Diagnosis not present

## 2022-09-13 DIAGNOSIS — I10 Essential (primary) hypertension: Secondary | ICD-10-CM | POA: Diagnosis not present

## 2022-09-13 DIAGNOSIS — I7121 Aneurysm of the ascending aorta, without rupture: Secondary | ICD-10-CM

## 2022-09-13 DIAGNOSIS — E785 Hyperlipidemia, unspecified: Secondary | ICD-10-CM

## 2022-09-13 MED ORDER — BOOSTRIX 5-2.5-18.5 LF-MCG/0.5 IM SUSY
0.5000 mL | PREFILLED_SYRINGE | Freq: Once | INTRAMUSCULAR | 0 refills | Status: AC
  Filled 2022-09-13: qty 0.5, 1d supply, fill #0

## 2022-09-13 NOTE — Patient Instructions (Signed)
Medication Instructions:  Continue your current medications.   *If you need a refill on your cardiac medications before your next appointment, please call your pharmacy*  Follow-Up: At [facility], you and your health needs are our priority.  As part of our continuing mission to provide you with exceptional heart care, we have created designated Provider Care Teams.  These Care Teams include your primary Cardiologist (physician) and Advanced Practice Providers (APPs -  Physician Assistants and Nurse Practitioners) who all work together to provide you with the care you need, when you need it.  We recommend signing up for the patient portal called "MyChart".  Sign up information is provided on this After Visit Summary.  MyChart is used to connect with patients for Virtual Visits (Telemedicine).  Patients are able to view lab/test results, encounter notes, upcoming appointments, etc.  Non-urgent messages can be sent to your provider as well.   To learn more about what you can do with MyChart, go to [email or web address].    Your next appointment:   As scheduled with Dr. Duke Salvia  Other Instructions  Please bring your blood pressure cuff to your next office visit.

## 2022-09-13 NOTE — Progress Notes (Unsigned)
Office Visit    Patient Name: Melvin Devoid, PhD Date of Encounter: 09/13/2022  PCP:  Margaree Mackintosh, MD   [facility]  Cardiologist:  Chilton Si, MD  Advanced Practice Provider:  No care team member to display Electrophysiologist:  None      Chief Complaint    Melvin Devoid, PhD is a 67 y.o. male presents today for hypertension   Past Medical History    Past Medical History:  Diagnosis Date   Allergy    Anxiety    2 years ago no longer on meds  Zoloft   Arthritis    osteoarthritis   Ascending aortic aneurysm (HCC) 06/06/2022   Asthma    Basal cell carcinoma    CAD in native artery 07/12/2022   Cancer (HCC)    Hyperlipidemia    Hypertension    Memory loss    Pure hypercholesterolemia 06/06/2022   Vision abnormalities    migraines   Past Surgical History:  Procedure Laterality Date   KNEE ARTHROSCOPY W/ MENISCAL REPAIR Right     Allergies  No Known Allergies  History of Present Illness    Melvin Devoid, PhD is a 67 y.o. male with a hx of CAD, ascending aortic aneurysm, HTN, HLD, asthma, OA, anxiety, basal cell carcinoma last seen 08/02/22  Coronary CT 03/2022 coronary calcium score of 26.4 which was in 34th percentile with calcifications noted to left main. Also ascending aortic aneurysm up to 46mm.  At visit 06/06/22 Carvedilol 3.125mg  BID was initiated. It was noted BP had previously dropped significantly on HCTZ. Losartan was continued. Rosuvastatin was stopped and transitioned to Atorvastatin due to myalgias. At visit 08/02/22 BP not at goal, Losartan transitioned to Olmesartan. Via subsequent MyChart message addition of low dose Spironolactone discussed for LE edema but preferred to monitor.  Presents today for follow up regarding his blood pressure. 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 Since last seen enjoyed a trip to [place] and [place]. Enjoying retirement. BP at home over the last two weeks  120-140s/80-100s. High at home than in clinic - home arm cuff not previously checked for accuracy. Reports no shortness of breath nor dyspnea on exertion. Reports no chest pain, pressure, or tightness. No  orthopnea, PND. LE edema much improved. Notes may have been more swollen due ot eating more salt inadvertently during travel. Reports no palpitations.    EKGs/Labs/Other Studies Reviewed:   The following studies were reviewed today:  Cardiac Studies & Procedures          CT SCANS  CT CARDIAC SCORING (SELF PAY ONLY) 03/22/2022  Addendum 03/22/2022  6:25 AM ADDENDUM REPORT: 03/22/2022 06:23  CLINICAL DATA:  Cardiovascular Disease Risk stratification  EXAM: Coronary Calcium Score  TECHNIQUE: A gated, non-contrast computed tomography scan of the heart was performed using 3mm slice thickness. Axial images were analyzed on a dedicated workstation. Calcium scoring of the coronary arteries was performed using the Agatston method.  FINDINGS: Coronary Calcium Score:  Left main: 26.4  Left anterior descending artery: 0  Left circumflex artery: 0  Right coronary artery: 0  Total: 26.4  Percentile: 34th  Pericardium: Normal.  Ascending aorta: Aneurysm up to 46 mm.  Non-cardiac: See separate report from [facility].  IMPRESSION: 1. Coronary calcium score of 26.4. This was 34th percentile for age-, race-, and sex-matched controls.  2. Ascending aortic aneurysm up to 46 mm.  RECOMMENDATIONS: Coronary artery calcium (CAC) score is a strong predictor of  incident coronary heart disease (CHD) and provides predictive information beyond traditional risk factors. CAC scoring is reasonable to use in the decision to withhold, postpone, or initiate statin therapy in intermediate-risk or selected borderline-risk asymptomatic adults (age 49-75 years and LDL-C >=70 to <190 mg/dL) who do not have diabetes or established atherosclerotic cardiovascular disease (ASCVD).* In  intermediate-risk (10-year ASCVD risk >=7.5% to <20%) adults or selected borderline-risk (10-year ASCVD risk >=5% to <7.5%) adults in whom a CAC score is measured for the purpose of making a treatment decision the following recommendations have been made:  If CAC=0, it is reasonable to withhold statin therapy and reassess in 5 to 10 years, as long as higher risk conditions are absent (diabetes mellitus, family history of premature CHD in first degree relatives (males <55 years; females <65 years), cigarette smoking, or LDL >=190 mg/dL).  If CAC is 1 to 99, it is reasonable to initiate statin therapy for patients >=79 years of age.  If CAC is >=100 or >=75th percentile, it is reasonable to initiate statin therapy at any age.  Cardiology referral should be considered for patients with CAC scores >=400 or >=75th percentile.  *2018 AHA/ACC/AACVPR/AAPA/ABC/ACPM/ADA/AGS/APhA/ASPC/NLA/PCNA Guideline on the Management of Blood Cholesterol: A Report of the American College of Cardiology/American Heart Association Task Force on Clinical Practice Guidelines. J Am Coll Cardiol. 2019;73(24):3168-3209.  Lennie Odor, MD   Electronically Signed By: Lennie Odor M.D. On: 03/22/2022 06:23  Narrative EXAM: OVER-READ INTERPRETATION  CT CHEST  The following report is a limited chest CT over-read performed by radiologist Dr. Signa Kell of [facility] on 03/21/2022. This over-read does not include interpretation of cardiac or coronary anatomy or pathology. The calcium score CT interpretation by the cardiologist is attached.  COMPARISON:  None Available.  FINDINGS: Vascular: No acute abnormality.  Mediastinum/Nodes: No mediastinal mass or adenopathy.  Lungs/Pleura: No pleural fluid. No airspace disease. No suspicious pulmonary nodule or mass.  Upper Abdomen: No acute abnormality. Cyst within left lobe of liver measures 1.5 cm.  Musculoskeletal: No acute findings.  Mild degenerative disc disease identified within the lower thoracic spine.  IMPRESSION: No acute or significant extracardiac findings.  Electronically Signed: By: Signa Kell M.D. On: 03/21/2022 11:47           EKG:  EKG is not ordered today  Recent Labs: 08/13/2022: ALT 19; ALT 19; BUN 17; Creat 1.20; Hemoglobin 14.7; Platelets 115; Potassium 4.7; Sodium 139  Recent Lipid Panel    Component Value Date/Time   CHOL 178 08/13/2022 0921   TRIG 91 08/13/2022 0921   HDL 59 08/13/2022 0921   CHOLHDL 3.0 08/13/2022 0921   VLDL 17 10/08/2016 1018   LDLCALC 100 (H) 08/13/2022 0921     Home Medications   Current Meds  Medication Sig   acetaminophen (TYLENOL) 500 MG tablet Take 500 mg by mouth as needed.   albuterol (VENTOLIN HFA) 108 (90 Base) MCG/ACT inhaler INHALE 2 PUFFS INTO THE LUNGS EVERY 6 HOURS AS NEEDED FOR WHEEZING   atorvastatin (LIPITOR) 10 MG tablet Take 1 tablet (10 mg total) by mouth daily.   azithromycin (ZITHROMAX Z-PAK) 250 MG tablet Take 2 tablets (500 mg) on  Day 1,  followed by 1 tablet (250 mg) once daily on Days 2 through 5.   benzonatate (TESSALON) 100 MG capsule Take 1 capsule (100 mg total) by mouth 3 (three) times daily as needed for cough.   carvedilol (COREG) 3.125 MG tablet Take 1 tablet (3.125 mg total) by mouth 2 (  two) times daily.   cetirizine (ZYRTEC) 10 MG tablet Take 10 mg by mouth daily.   cyanocobalamin (,VITAMIN B-12,) 1000 MCG/ML injection INJECT INTRAMUSCULARLY ONCE A MONTH   doxycycline (ADOXA) 50 MG tablet Take 50 mg by mouth daily.   esomeprazole (NEXIUM) 40 MG capsule TAKE 1 CAPSULE BY MOUTH ONCE DAILY   finasteride (PROSCAR) 5 MG tablet Take 5 mg by mouth daily.   fluticasone (FLONASE) 50 MCG/ACT nasal spray USE 2 SPRAYS INTO EACH NOSTRIL TWICE DAILY   fluticasone furoate-vilanterol (BREO ELLIPTA) 100-25 MCG/ACT AEPB Inhale 1 puff into the lungs daily.   HYDROcodone bit-homatropine (HYCODAN) 5-1.5 MG/5ML syrup Take 5 mLs by mouth  every 8 (eight) hours as needed for cough.   methylPREDNISolone (MEDROL) 4 MG tablet Take in tapering course as directed for bronchospasm: 6 tabs by mouth on day one and decrease by one tab daily 6-5-4-3-2-1 taper   montelukast (SINGULAIR) 10 MG tablet TAKE 1 TABLET BY MOUTH AT BEDTIME   neomycin-polymyxin-hydrocortisone (CORTISPORIN) OTIC solution Apply one to two drops to toe after soaking twice daily.   olmesartan (BENICAR) 40 MG tablet Take 1 tablet (40 mg total) by mouth daily.   potassium chloride SA (KLOR-CON M) 20 MEQ tablet TAKE 1 TABLET BY MOUTH ONCE DAILY   tamsulosin (FLOMAX) 0.4 MG CAPS capsule Take 1 capsule (0.4 mg total) by mouth daily.   Tdap (BOOSTRIX) 5-2.5-18.5 LF-MCG/0.5 injection Inject 0.5 mLs into the muscle once for 1 dose.   Current Facility-Administered Medications for the 09/13/22 encounter (Office Visit) with Alver Sorrow, NP  Medication   0.9 %  sodium chloride infusion     Review of Systems      All other systems reviewed and are otherwise negative except as noted above.  Physical Exam    VS:  BP 118/86   Pulse 70   Ht 6' 1.5" (1.867 m)   Wt 218 lb (98.9 kg)   BMI 28.37 kg/m  , BMI Body mass index is 28.37 kg/m.  Wt Readings from Last 3 Encounters:  09/13/22 218 lb (98.9 kg)  08/19/22 217 lb 12.8 oz (98.8 kg)  08/02/22 220 lb (99.8 kg)     GEN: Well nourished, well developed, in no acute distress. HEENT: normal. Neck: Supple, no JVD, carotid bruits, or masses. Cardiac: RRR, no murmurs, rubs, or gallops. No clubbing, cyanosis, edema.  Radials/PT 2+ and equal bilaterally.  Respiratory:  Respirations regular and unlabored, clear to auscultation bilaterally. GI: Soft, nontender, nondistended. MS: No deformity or atrophy. Skin: Warm and dry, no rash. Neuro:  Strength and sensation are intact. Psych: Normal affect.  Assessment & Plan    Nonobstructive CAD - Stable with no anginal symptoms. No indication for ischemic evaluation.  GDMT  Atorvastatin, Carvedilol. Heart healthy diet and regular cardiovascular exercise encouraged.    HLD, LDL goal <70 - Prior intolerance to Rosuvastatin. Tolerating Atorvastatin. 08/13/22 LDL 100. More time for diet/exercise in retirement, reassess lipid in 4-6 months. If LDL not at goal trial increasing dose vs addition of Zetia.   HTN - BP at goal <130/80 in clinic. Home readings 120-130s/80-90s - anticipate home cuff higher than true reading and will bring cuff to follow up. Continue Olmesartan, Carvedilol. If BP not at goal <130/80, could consider addition of low dose Spironolactone and discontinuation of potassium supplement.   Ascending aortic aneurysm - CT 03/21/22 up to 46mm. Continue optimal BP control as above and beta blocker. Avoid heavy lifting, fluoroquinolones. Echo already scheduled for 11/2022 for monitoring.  Disposition: Follow up  as scheduled  with Chilton Si, MD or APP.  Signed, Alver Sorrow, NP 09/13/2022, 3:37 PM [facility]

## 2022-09-15 ENCOUNTER — Encounter (HOSPITAL_BASED_OUTPATIENT_CLINIC_OR_DEPARTMENT_OTHER): Payer: Self-pay | Admitting: Family

## 2022-10-31 ENCOUNTER — Other Ambulatory Visit (HOSPITAL_BASED_OUTPATIENT_CLINIC_OR_DEPARTMENT_OTHER): Payer: Self-pay | Admitting: Family

## 2022-10-31 DIAGNOSIS — I10 Essential (primary) hypertension: Secondary | ICD-10-CM

## 2022-10-31 NOTE — Telephone Encounter (Signed)
Rx(s) sent to pharmacy electronically.  

## 2022-11-13 ENCOUNTER — Other Ambulatory Visit: Payer: Self-pay | Admitting: Internal Medicine

## 2022-12-11 ENCOUNTER — Ambulatory Visit (INDEPENDENT_AMBULATORY_CARE_PROVIDER_SITE_OTHER): Payer: BC Managed Care – PPO

## 2022-12-11 DIAGNOSIS — I10 Essential (primary) hypertension: Secondary | ICD-10-CM

## 2022-12-11 DIAGNOSIS — I7121 Aneurysm of the ascending aorta, without rupture: Secondary | ICD-10-CM | POA: Diagnosis not present

## 2022-12-12 LAB — ECHOCARDIOGRAM COMPLETE
AV Vena cont: 0.51 cm
Area-P 1/2: 2.99 cm2
P 1/2 time: 963 msec
S' Lateral: 2.95 cm

## 2022-12-18 ENCOUNTER — Other Ambulatory Visit (HOSPITAL_BASED_OUTPATIENT_CLINIC_OR_DEPARTMENT_OTHER): Payer: Self-pay

## 2022-12-18 ENCOUNTER — Ambulatory Visit (HOSPITAL_BASED_OUTPATIENT_CLINIC_OR_DEPARTMENT_OTHER): Payer: Medicare PPO | Admitting: Cardiovascular Disease

## 2022-12-18 ENCOUNTER — Encounter (HOSPITAL_BASED_OUTPATIENT_CLINIC_OR_DEPARTMENT_OTHER): Payer: Self-pay | Admitting: Cardiovascular Disease

## 2022-12-18 ENCOUNTER — Other Ambulatory Visit: Payer: Self-pay | Admitting: Internal Medicine

## 2022-12-18 VITALS — BP 142/90 | HR 54 | Ht 74.0 in | Wt 215.3 lb

## 2022-12-18 DIAGNOSIS — E7800 Pure hypercholesterolemia, unspecified: Secondary | ICD-10-CM

## 2022-12-18 DIAGNOSIS — I2510 Atherosclerotic heart disease of native coronary artery without angina pectoris: Secondary | ICD-10-CM | POA: Diagnosis not present

## 2022-12-18 DIAGNOSIS — I7121 Aneurysm of the ascending aorta, without rupture: Secondary | ICD-10-CM | POA: Diagnosis not present

## 2022-12-18 DIAGNOSIS — R0609 Other forms of dyspnea: Secondary | ICD-10-CM

## 2022-12-18 DIAGNOSIS — I10 Essential (primary) hypertension: Secondary | ICD-10-CM | POA: Diagnosis not present

## 2022-12-18 MED ORDER — AMLODIPINE BESYLATE 2.5 MG PO TABS
2.5000 mg | ORAL_TABLET | Freq: Every day | ORAL | 3 refills | Status: DC
Start: 2022-12-18 — End: 2023-03-17

## 2022-12-18 MED ORDER — FLUAD 0.5 ML IM SUSY
0.5000 mL | PREFILLED_SYRINGE | Freq: Once | INTRAMUSCULAR | 0 refills | Status: AC
  Filled 2022-12-18: qty 0.5, 1d supply, fill #0

## 2022-12-18 NOTE — Patient Instructions (Addendum)
Medication Instructions:  START AMLODIPINE 2.5 MG DAILY   *If you need a refill on your cardiac medications before your next appointment, please call your pharmacy*  Lab Work: FASTING LP/CMET 1 WEEK PRIOR TO CT   If you have labs (blood work) drawn today and your tests are completely normal, you will receive your results only by: MyChart Message (if you have MyChart) OR A paper copy in the mail If you have any lab test that is abnormal or we need to change your treatment, we will call you to review the results.  Testing/Procedures: Your physician has requested that you have cardiac CT. Cardiac computed tomography (CT) is a painless test that uses an x-ray machine to take clear, detailed pictures of your heart. For further information please visit [email or web address] Please follow instruction sheet as given.  Follow-Up: 02/03/2023 11:00 AM VIRTUAL VISIT   Other Instructions   Your cardiac CT will be scheduled at one of the below locations:   [facility] [address] [place], [place] [address] [phone]  OR  [facility] [address] [place], [place] [address] [phone]  OR   [facility] [address] [place], [place] [address] [phone]  If scheduled at [facility], please arrive at the [facility] and Children's Entrance (Entrance C2) of [facility] 30 minutes prior to test start time. You can use the FREE valet parking offered at entrance C (encouraged to control the heart rate for the test)  Proceed to the [facility] Radiology Department (first floor) to check-in and test prep.  All radiology patients and guests should use entrance C2 at [facility], accessed from [facility], even though the hospital's physical address listed is [address].    If scheduled at [facility] or [facility], please arrive 15 mins early for check-in and test prep.  There is spacious parking and easy access to the radiology department from the [facility] entrance. Please enter here and check-in with the desk attendant.   Please follow these instructions carefully (unless otherwise directed):  An IV will be required for this test and Nitroglycerin will be given.  Hold all erectile dysfunction medications at least 3 days (72 hrs) prior to test. (Ie viagra, cialis, sildenafil, tadalafil, etc)   On the Night Before the Test: Be sure to Drink plenty of water. Do not consume any caffeinated/decaffeinated beverages or chocolate 12 hours prior to your test. Do not take any antihistamines 12 hours prior to your test.  On the Day of the Test: Drink plenty of water until 1 hour prior to the test. Do not eat any food 1 hour prior to test. You may take your regular medications prior to the test.  Take metoprolol (Lopressor) two hours prior to test. If you take Furosemide/Hydrochlorothiazide/Spironolactone, please HOLD on the morning of the test.       After the Test: Drink plenty of water. After receiving IV contrast, you may experience a mild flushed feeling. This is normal. On occasion, you may experience a mild rash up to 24 hours after the test. This is not dangerous. If this occurs, you can take Benadryl 25 mg and increase your fluid intake. If you experience trouble breathing, this can be serious. If it is severe call 911 IMMEDIATELY. If it is mild, please call our office. If you take any of these medications: Glipizide/Metformin, Avandament,  Glucavance, please do not take 48 hours after completing test unless otherwise instructed.  We will call to schedule your test 2-4 weeks out understanding that some insurance companies will need an authorization prior to the service being performed.   For more information and frequently asked questions, please visit our website :  [email or web address]  For non-scheduling related questions, please contact the cardiac imaging nurse navigator should you have any questions/concerns: Cardiac Imaging Nurse Navigators Direct Office Dial: [phone]   For scheduling needs, including cancellations and rescheduling, please call [place], [phone].  Cardiac CT Angiogram A cardiac CT angiogram is a procedure to look at the heart and the area around the heart. It may be done to help find the cause of chest pains or other symptoms of heart disease. During this procedure, a substance called contrast dye is injected into a vein in the arm. The contrast highlights the blood vessels in the area to be checked. A large X-ray machine (CT scanner), then takes detailed pictures of the heart and the surrounding area. The procedure is also sometimes called a coronary CT angiogram, coronary artery scanning, or CTA. A cardiac CT angiogram allows the health care provider to see how well blood is flowing to and from the heart. The provider will be able to see if there are any problems, such as: Blockage or narrowing of the arteries in the heart. Fluid around the heart. Signs of weakness or disease in the muscles, valves, and tissues of the heart. Tell a health care provider about: Any allergies you have. This is especially important if you have had a previous allergic reaction to medicines, contrast dye, or iodine. All medicines you are taking, including vitamins, herbs, eye drops, creams, and over-the-counter medicines. Any bleeding problems you have. Any surgeries you have had. Any medical conditions you have, including kidney problems or kidney failure. Whether you are pregnant or may be pregnant. Any anxiety disorders, chronic pain, or other conditions you have. These may increase your stress or prevent you from lying still. Any history of abnormal heart rhythms or heart procedures. What are the risks? Your provider will  talk with you about risks. These may include: Bleeding. Infection. Allergic reactions to medicines or dyes. Damage to other structures or organs. Kidney damage from the contrast dye. Increased risk of cancer from radiation exposure. This risk is low. Talk with your provider about: The risks and benefits of testing. How you can receive the lowest dose of radiation. What happens before the procedure? Wear comfortable clothing and remove any jewelry, glasses, dentures, and hearing aids. Follow instructions from your provider about eating and drinking. These may include: 12 hours before the procedure Avoid caffeine. This includes tea, coffee, soda, energy drinks, and diet pills. Drink plenty of water or other fluids that do not have caffeine in them. Being well hydrated can prevent complications. 4-6 hours before the procedure Stop eating and drinking. This will reduce the risk of nausea from the contrast dye. Ask your provider about changing or stopping your regular medicines. These include: Diabetes medicines. Medicines to treat problems with erections (erectile dysfunction). If you have kidney problems, you may need to receive IV hydration before and after the test. What happens during the procedure?  Hair on your chest may need to be removed so that small sticky patches called electrodes can be placed on your chest. These will transmit information that helps to monitor your heart during the procedure. An IV will be inserted into one of your veins. You  might be given a medicine to control your heart rate during the procedure. This will help to ensure that good images are obtained. You will be asked to lie on an exam table. This table will slide in and out of the CT machine during the procedure. Contrast dye will be injected into the IV. You might feel warm, or you may get a metallic taste in your mouth. You may be given medicines to relax or dilate the arteries in your heart. If you are  allergic to contrast dyes or iodine you may be given medicine before the test to reduce the risk of an allergic reaction. The table that you are lying on will move into the CT machine tunnel for the scan. The person running the machine will give you instructions while the scans are being done. You may be asked to: Keep your arms above your head. Hold your breath for short periods. Stay very still, even if the table is moving. The procedure may vary among providers and hospitals. What can I expect after the procedure? After your procedure, it is common to have: A metallic taste in your mouth from the contrast dye. A feeling of warmth. A headache from the heart medicine. Follow these instructions at home: Take over-the-counter and prescription medicines only as told by your provider. If you are told, drink enough fluid to keep your pee pale yellow. This will help to flush the contrast dye out of your body. Most people can return to their normal activities right after the procedure. Ask your provider what activities are safe for you. It is up to you to get the results of your procedure. Ask your provider, or the department that is doing the procedure, when your results will be ready. Contact a health care provider if: You have any symptoms of allergy to the contrast dye. These include: Shortness of breath. Rash or hives. A racing heartbeat. You notice a change in your peeing (urination). This information is not intended to replace advice given to you by your health care provider. Make sure you discuss any questions you have with your health care provider. Document Revised: 11/02/2021 Document Reviewed: 11/02/2021 Elsevier Patient Education  2024 [employer].

## 2022-12-18 NOTE — Progress Notes (Signed)
Cardiology Office Note:  .   Date:  12/18/2022  ID:  Melvin Devoid, PhD, DOB June 13, 1955, MRN [id number] PCP: Margaree Mackintosh, MD  [facility] Providers Cardiologist:  Chilton Si, MD    History of Present Illness: .    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 male with a hx of nonobstructive CAD, ascending aortic aneurysm, hypertension, hyperlipidemia, asthma, osteoarthritis, anxiety, and basal cell carcinoma, here for follow-up.  He was first seen 05/2022 for the evaluation of elevated coronary calcium score. He was seen by Dr. Lenord Fellers 01/2022. He was on rosuvastatin 5 mg daily. He had a coronary CT 03/2022 revealing a coronary calcium score of 26.4, which was 34th percentile. Calcifications noted in the left main. Also noted to have an ascending aortic aneurysm up to 46 mm .  He noted some occasional chest discomfort when lying in bed at night but had no exertional symptoms.  He did have some mild lower extremity edema.  Home blood pressures were mildly elevated and he was started on carvedilol, especially given his ascending aortic aneurysm.  He had myalgias on rosuvastatin and was switched to atorvastatin.  He followed up with Luther Parody Walker/2024 and noted that his leg cramps have reduced.  Blood pressure remained uncontrolled and losartan was switched to olmesartan.  Blood pressure improved at follow-up.  Echo 11/2022 revealed LVEF 55-60% with indeterminate diastolic function.  Ascending aorta was 4.8 cm.  Dr. Susy Manor reports an increase in energy and a decrease in chest pain. They describe the chest pain as having been significant in the past but now greatly reduced, although they still experience shortness of breath. The patient has been monitoring their blood pressure at home, noting that it has been slightly elevated with diastolic readings in the 90 to 95 range and a systolic reading of 152 on the day of the visit.  The patient has been adhering to their medication regimen, which includes carvedilol,  and has noticed a decrease in heart rate to 54. They report no significant dietary changes, following a "seafood diet" where they eat when they see food, but have been mindful about salt intake.  The patient also mentions an incident of dizziness and loss of balance after sitting on a concrete block, attributing it to possible dehydration from not drinking enough water in hot weather. They have also been participating in a clinical trial for an mRNA vaccine for RSV, during which they contracted RSV and experienced significant lung issues.  He is also on atorvastatin for cholesterol management, having switched from a previous statin due to muscle cramps. They report that the muscle cramps have mostly subsided since the switch. The patient is active, engaging in activities such as golf and helping a neighbor with various projects. They have plans for a cruise trip in the near future.  Prior intolerances:  HCTZ  ROS:  As per HPI  Studies Reviewed: .       Echo 11/2022:   1. Left ventricular ejection fraction, by estimation, is 55 to 60%. The  left ventricle has normal function. The left ventricle has no regional  wall motion abnormalities. Left ventricular diastolic parameters are  indeterminate.   2. Right ventricular systolic function is normal. The right ventricular  size is normal. There is normal pulmonary artery systolic pressure.   3. The mitral valve is normal in structure. Trivial mitral valve  regurgitation. No evidence of mitral stenosis.   4. The aortic valve is tricuspid. There is mild calcification of the  aortic valve. Aortic valve regurgitation is mild. Aortic valve sclerosis  is present, with no evidence of aortic valve stenosis.   5. Aortic dilatation noted. There is moderate dilatation of the ascending  aorta, measuring 48 mm.   6. The inferior vena cava is normal in size with greater than 50%  respiratory variability, suggesting right atrial pressure of 3 mmHg.   Risk  Assessment/Calculations:     HYPERTENSION CONTROL Vitals:   12/18/22 0807 12/18/22 0823  BP: (!) 152/100 (!) 142/90    The patient's blood pressure is elevated above target today.  In order to address the patient's elevated BP: A new medication was prescribed today.          Physical Exam:    VS:  BP (!) 142/90   Pulse (!) 54   Ht 6\' 2"  (1.88 m)   Wt 215 lb 4.8 oz (97.7 kg)   BMI 27.64 kg/m  , BMI Body mass index is 27.64 kg/m. GENERAL:  Well appearing HEENT: Pupils equal round and reactive, fundi not visualized, oral mucosa unremarkable NECK:  No jugular venous distention, waveform within normal limits, carotid upstroke brisk and symmetric, no bruits, no thyromegaly LUNGS:  Clear to auscultation bilaterally HEART:  RRR.  PMI not displaced or sustained,S1 and S2 within normal limits, no S3, no S4, no clicks, no rubs, no murmurs ABD:  Flat, positive bowel sounds normal in frequency in pitch, no bruits, no rebound, no guarding, no midline pulsatile mass, no hepatomegaly, no splenomegaly EXT:  2 plus pulses throughout, no edema, no cyanosis no clubbing SKIN:  No rashes no nodules NEURO:  Cranial nerves II through XII grossly intact, motor grossly intact throughout PSYCH:  Cognitively intact, oriented to person place and time   ASSESSMENT AND PLAN: .    # Hypertension Elevated blood pressure readings at home and in clinic (152/100). Currently on Carvedilol and Olmesartan. No significant side effects reported. -Add Amlodipine 2.5mg  daily to current regimen. -Check blood pressure at home and report readings during a virtual visit in 1-2 months.  # Aortic Aneurysm Slight increase in  aortic size on recent echocardiogram (4.8 cm) compared to prior CT. No symptoms of aortic dissection. -Order CT aortogram for more precise measurement and comparison to prior CT. -Continue Carvedilol for blood pressure control and reduction of shear stress on the aorta.  #  Hyperlipidemia Currently on Atorvastatin. No recent lipid panel available. No significant muscle cramps reported.  LDL goal <70. -Obtain fasting lipid panel and comprehensive metabolic panel when patient is able to come in fasting. -Continue Atorvastatin at current dose.  # Chest Pain/Shortness of Breath Reduced chest pain compared to prior. Still experiences shortness of breath. No clear etiology identified yet. -Order coronary CT angiogram to rule out coronary artery disease as a cause of symptoms. -If symptoms worsen, patient to notify clinic.  Follow-up in 1 year, or sooner if needed based on results of imaging and labs or changes in clinical status.             Dispo: F/u virtual 6 months.  In person in 1 year.  Signed, Chilton Si, MD

## 2022-12-23 ENCOUNTER — Other Ambulatory Visit (HOSPITAL_BASED_OUTPATIENT_CLINIC_OR_DEPARTMENT_OTHER): Payer: Self-pay

## 2022-12-23 MED ORDER — COVID-19 MRNA VAC-TRIS(PFIZER) 30 MCG/0.3ML IM SUSY
0.3000 mL | PREFILLED_SYRINGE | Freq: Once | INTRAMUSCULAR | 0 refills | Status: AC
  Filled 2022-12-23 (×2): qty 0.3, 1d supply, fill #0

## 2022-12-24 ENCOUNTER — Encounter (HOSPITAL_COMMUNITY): Payer: Self-pay

## 2022-12-24 LAB — COMPREHENSIVE METABOLIC PANEL
ALT: 26 IU/L (ref 0–44)
AST: 17 IU/L (ref 0–40)
Albumin: 4.4 g/dL (ref 3.9–4.9)
Alkaline Phosphatase: 71 IU/L (ref 44–121)
BUN/Creatinine Ratio: 16 (ref 10–24)
BUN: 16 mg/dL (ref 8–27)
Bilirubin Total: 0.8 mg/dL (ref 0.0–1.2)
CO2: 25 mmol/L (ref 20–29)
Calcium: 8.8 mg/dL (ref 8.6–10.2)
Chloride: 104 mmol/L (ref 96–106)
Creatinine, Ser: 1.03 mg/dL (ref 0.76–1.27)
Globulin, Total: 1.8 g/dL (ref 1.5–4.5)
Glucose: 96 mg/dL (ref 70–99)
Potassium: 4.6 mmol/L (ref 3.5–5.2)
Sodium: 141 mmol/L (ref 134–144)
Total Protein: 6.2 g/dL (ref 6.0–8.5)
eGFR: 80 mL/min/{1.73_m2} (ref >59)

## 2022-12-24 LAB — LIPID PANEL
Chol/HDL Ratio: 2.9 ratio (ref 0.0–5.0)
Cholesterol, Total: 161 mg/dL (ref 100–199)
HDL: 56 mg/dL (ref >39)
LDL Chol Calc (NIH): 90 mg/dL (ref 0–99)
Triglycerides: 78 mg/dL (ref 0–149)
VLDL Cholesterol Cal: 15 mg/dL (ref 5–40)

## 2022-12-25 ENCOUNTER — Ambulatory Visit (HOSPITAL_COMMUNITY)
Admission: RE | Admit: 2022-12-25 | Discharge: 2022-12-25 | Disposition: A | Discharge status: Home / Self Care | Payer: Medicare PPO | Source: Ambulatory Visit | Attending: Cardiovascular Disease | Admitting: Cardiovascular Disease

## 2022-12-25 DIAGNOSIS — R0609 Other forms of dyspnea: Secondary | ICD-10-CM | POA: Diagnosis present

## 2022-12-25 DIAGNOSIS — I2510 Atherosclerotic heart disease of native coronary artery without angina pectoris: Secondary | ICD-10-CM

## 2022-12-25 MED ORDER — NITROGLYCERIN 0.4 MG SL SUBL
0.8000 mg | SUBLINGUAL_TABLET | Freq: Once | SUBLINGUAL | Status: AC
  Administered 2022-12-25: 0.8 mg via SUBLINGUAL

## 2022-12-25 MED ORDER — NITROGLYCERIN 0.4 MG SL SUBL
SUBLINGUAL_TABLET | SUBLINGUAL | Status: AC
  Filled 2022-12-25: qty 2

## 2022-12-25 MED ORDER — IOHEXOL 350 MG/ML SOLN
95.0000 mL | Freq: Once | INTRAVENOUS | Status: AC | PRN
  Administered 2022-12-25: 95 mL via INTRAVENOUS

## 2023-01-07 ENCOUNTER — Telehealth (HOSPITAL_BASED_OUTPATIENT_CLINIC_OR_DEPARTMENT_OTHER): Payer: Self-pay

## 2023-01-07 DIAGNOSIS — I10 Essential (primary) hypertension: Secondary | ICD-10-CM

## 2023-01-07 DIAGNOSIS — I7121 Aneurysm of the ascending aorta, without rupture: Secondary | ICD-10-CM

## 2023-01-07 NOTE — Addendum Note (Signed)
Addended by: Marlene Lard on: 01/07/2023 12:30 PM   Modules accepted: Orders

## 2023-01-07 NOTE — Telephone Encounter (Addendum)
een by patient Melvin Devoid, PhD on 01/06/2023  5:30 PM; follow up mychart message sent to patient.    ----- Message from Chilton Si sent at 01/04/2023  8:46 AM EDT ----- There is minimal plaque in your coronary arteries.  Continue with diet, exercise and medication.  The aorta was not fully visualized on this study.  The lower portion of the aorta was mildly dilated, but it didn't show the area of concern.  Recommend getting a CT-A focused on the thoracic aorta.

## 2023-01-17 ENCOUNTER — Telehealth (HOSPITAL_BASED_OUTPATIENT_CLINIC_OR_DEPARTMENT_OTHER): Payer: Self-pay

## 2023-01-17 DIAGNOSIS — E7800 Pure hypercholesterolemia, unspecified: Secondary | ICD-10-CM

## 2023-01-17 MED ORDER — ATORVASTATIN CALCIUM 20 MG PO TABS: 20.0000 mg | ORAL_TABLET | Freq: Every day | ORAL | 3 refills | Status: DC

## 2023-01-17 NOTE — Telephone Encounter (Addendum)
Seen by patient Melvin Devoid, PhD on 01/17/2023  7:38 AM, follow up mychart message sent to patient.    ----- Message from Alver Sorrow sent at 01/16/2023  8:37 PM EDT ----- Normal kidneys, liver, electrolytes.  LDL (bad cholesterol) of 90 which is not at goal of <70 given coronary calcification. Recommend increase Atorvastatin to 20mg  daily with FLP/LFT in 2 months.

## 2023-01-17 NOTE — Addendum Note (Signed)
Addended by: Marlene Lard on: 01/17/2023 08:04 AM   Modules accepted: Orders

## 2023-01-28 ENCOUNTER — Ambulatory Visit (HOSPITAL_COMMUNITY)
Admission: RE | Admit: 2023-01-28 | Discharge: 2023-01-28 | Disposition: A | Discharge status: Home / Self Care | Payer: Medicare PPO | Source: Ambulatory Visit | Attending: Cardiovascular Disease | Admitting: Cardiovascular Disease

## 2023-01-28 ENCOUNTER — Other Ambulatory Visit (HOSPITAL_BASED_OUTPATIENT_CLINIC_OR_DEPARTMENT_OTHER): Payer: Self-pay

## 2023-01-28 ENCOUNTER — Telehealth (HOSPITAL_BASED_OUTPATIENT_CLINIC_OR_DEPARTMENT_OTHER): Payer: Self-pay

## 2023-01-28 DIAGNOSIS — I7121 Aneurysm of the ascending aorta, without rupture: Secondary | ICD-10-CM

## 2023-01-28 DIAGNOSIS — I10 Essential (primary) hypertension: Secondary | ICD-10-CM | POA: Diagnosis present

## 2023-01-28 LAB — LIPID PANEL
Chol/HDL Ratio: 3.1 {ratio} (ref 0.0–5.0)
Cholesterol, Total: 156 mg/dL (ref 100–199)
HDL: 51 mg/dL (ref >39)
LDL Chol Calc (NIH): 85 mg/dL (ref 0–99)
Triglycerides: 111 mg/dL (ref 0–149)
VLDL Cholesterol Cal: 20 mg/dL (ref 5–40)

## 2023-01-28 LAB — HEPATIC FUNCTION PANEL
ALT: 27 [IU]/L (ref 0–44)
AST: 22 [IU]/L (ref 0–40)
Albumin: 4.4 g/dL (ref 3.9–4.9)
Alkaline Phosphatase: 73 [IU]/L (ref 44–121)
Bilirubin Total: 1 mg/dL (ref 0.0–1.2)
Bilirubin, Direct: 0.25 mg/dL (ref 0.00–0.40)
Total Protein: 6.2 g/dL (ref 6.0–8.5)

## 2023-01-28 MED ORDER — IOHEXOL 350 MG/ML SOLN
75.0000 mL | Freq: Once | INTRAVENOUS | Status: AC | PRN
  Administered 2023-01-28: 75 mL via INTRAVENOUS

## 2023-01-28 NOTE — Telephone Encounter (Addendum)
Seen by patient Melvin Devoid, PhD on 01/28/2023  2:46 PM; followup mychart message sent to patient.   ----- Message from Alver Sorrow sent at 01/28/2023  1:26 PM EDT ----- Normal liver numbers.  Cholesterol with LDL (bad cholesterol) of 85 which is improved from previous of 90 but not yet at goal of <70. Would recommend further increasing Atorvastatin to 40mg  daily with repeat FLP/LFT in 3 months.

## 2023-02-03 ENCOUNTER — Encounter (HOSPITAL_BASED_OUTPATIENT_CLINIC_OR_DEPARTMENT_OTHER): Payer: Self-pay | Admitting: Cardiovascular Disease

## 2023-02-03 ENCOUNTER — Telehealth (INDEPENDENT_AMBULATORY_CARE_PROVIDER_SITE_OTHER): Payer: Medicare PPO | Admitting: Cardiovascular Disease

## 2023-02-03 VITALS — BP 122/86

## 2023-02-03 DIAGNOSIS — I7121 Aneurysm of the ascending aorta, without rupture: Secondary | ICD-10-CM

## 2023-02-03 DIAGNOSIS — R0600 Dyspnea, unspecified: Secondary | ICD-10-CM

## 2023-02-03 DIAGNOSIS — R400 Somnolence: Secondary | ICD-10-CM

## 2023-02-03 DIAGNOSIS — I10 Essential (primary) hypertension: Secondary | ICD-10-CM | POA: Diagnosis not present

## 2023-02-03 DIAGNOSIS — I2510 Atherosclerotic heart disease of native coronary artery without angina pectoris: Secondary | ICD-10-CM | POA: Diagnosis not present

## 2023-02-03 DIAGNOSIS — R0683 Snoring: Secondary | ICD-10-CM

## 2023-02-03 DIAGNOSIS — E7800 Pure hypercholesterolemia, unspecified: Secondary | ICD-10-CM | POA: Diagnosis not present

## 2023-02-03 NOTE — Progress Notes (Deleted)
Cardiology Office Note:  .   Date:  02/03/2023  ID:  Melvin Devoid, PhD, DOB Mar 04, 1956, MRN [id number] PCP: Margaree Mackintosh, MD  [facility] Providers Cardiologist:  Chilton Si, MD { Click to update primary MD,subspecialty MD or APP then REFRESH:1}   History of Present Illness: .   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 male with a hx of nonobstructive CAD, ascending aortic aneurysm, hypertension, hyperlipidemia, asthma, osteoarthritis, anxiety, and basal cell carcinoma, here for follow-up.  He was first seen 05/2022 for the evaluation of elevated coronary calcium score. He was seen by Dr. Lenord Fellers 01/2022. He was on rosuvastatin 5 mg daily. He had a coronary CT 03/2022 revealing a coronary calcium score of 26.4, which was 34th percentile. Calcifications noted in the left main. Also noted to have an ascending aortic aneurysm up to 46 mm .  He noted some occasional chest discomfort when lying in bed at night but had no exertional symptoms.  He did have some mild lower extremity edema.  Home blood pressures were mildly elevated and he was started on carvedilol, especially given his ascending aortic aneurysm.  He had myalgias on rosuvastatin and was switched to atorvastatin.  He followed up with Luther Parody Walker/2024 and noted that his leg cramps have reduced.  Blood pressure remained uncontrolled and losartan was switched to olmesartan.  Blood pressure improved at follow-up.  Echo 11/2022 revealed LVEF 55-60% with indeterminate diastolic function.  Ascending aorta was 4.8 cm.   At his last appointment he reported an increase in his energy and a decrease in chest pain.  He did continue to have exertional dyspnea and his aorta was noted to be more enlarged than previously.  Coronary CTA revealed minimal plaque in the LAD and RCA.  The aortic root was 4.1 cm.  The entire aorta was not well-visualized so a chest CTA was obtained which revealed that the aorta was only 4.1 cm, not 4.8 cm as previously noted on echo.   He was also noted to have hepatic steatosis and a right thyroid nodule.  He noted that blood pressures at home are running in the 150s over 90s.  Amlodipine was added.  ROS: ***  Studies Reviewed: Marland Kitchen       Chest CT-A 01/2023: IMPRESSION: 1. Slight dilatation of the aortic root and ascending segment measuring 4.1 cm and 4 cm, respectively. No dissection or hematoma. Recommend annual imaging followup by CTA or MRA. This recommendation follows 2010 ACCF/AHA/AATS/ACR/ASA/SCA/SCAI/SIR/STS/ SVM Guidelines for the Diagnosis and Management of Patients with Thoracic Aortic Disease. Circulation. 2010; 121: N829-F621. Aortic aneurysm NOS (ICD10-I71.9). 2. Aortic soft plaques and trace single-vessel coronary artery atherosclerosis. 3. No acute chest CT or CTA findings. 4. Hepatic steatosis. 5. 1.1 cm right thyroid nodule.  No follow-up imaging recommended. 6. Osteopenia and degenerative changes of the spine. 7. Small hepatic cyst.  Coronary CT-A 12/2022: IMPRESSION: 1. Coronary calcium score of 8.70. This was 24 percentile for age-, sex, and race-matched controls.   2. Total plaque volume 58 mm3 which is 9 percentile for age- and sex-matched controls (calcified plaque 3 mm3; non-calcified plaque 55 mm3). TPV is (mild).   3. Normal coronary origin with right dominance.   4. Minimal (0-24) plaque in the LAD and RCA.   5. Dilated aortic root (41 mm).  Risk Assessment/Calculations:   {Does this patient have ATRIAL FIBRILLATION?:[id number]} No BP recorded.  {Refresh Note OR Click here to enter BP  :1}***       Physical Exam:  VS:  There were no vitals taken for this visit.   Wt Readings from Last 3 Encounters:  12/18/22 215 lb 4.8 oz (97.7 kg)  09/13/22 218 lb (98.9 kg)  08/19/22 217 lb 12.8 oz (98.8 kg)    GEN: Well nourished, well developed in no acute distress NECK: No JVD; No carotid bruits CARDIAC: ***RRR, no murmurs, rubs, gallops RESPIRATORY:  Clear to auscultation without  rales, wheezing or rhonchi  ABDOMEN: Soft, non-tender, non-distended EXTREMITIES:  No edema; No deformity   ASSESSMENT AND PLAN: .   ***    {Are you ordering a CV Procedure (e.g. stress test, cath, DCCV, TEE, etc)?   Press F2        :[id number]}  Dispo: ***  Signed, Chilton Si, MD

## 2023-02-03 NOTE — Progress Notes (Signed)
Virtual Visit via Video Note   Because of Melvin Dixon, Melvin Dixon's co-morbid illnesses, he is at least at moderate risk for complications without adequate follow up.  This format is felt to be most appropriate for this patient at this time.  All issues noted in this document were discussed and addressed.  A limited physical exam was performed with this format.  Please refer to the patient's chart for his consent to telehealth for [facility].  The patient was identified using 2 identifiers.  Date:  02/03/2023   ID:  Melvin Dixon, Melvin Dixon, DOB Aug 24, 1955, MRN [id number]  Patient Location: Home Provider Location: Office/Clinic  PCP:  Margaree Mackintosh, MD  Cardiologist:  Chilton Si, MD  Electrophysiologist:  None   Evaluation Performed:  Follow-Up Visit  History of Present Illness: .    The patient does not have symptoms concerning for COVID-19 infection (fever, chills, cough, or new shortness of breath).   Melvin Dixon, Melvin Dixon is a 67 y.o. male with a hx of nonobstructive CAD, ascending aortic aneurysm, hypertension, hyperlipidemia, asthma, osteoarthritis, anxiety, and basal cell carcinoma, here for follow-up.  He was first seen 05/2022 for the evaluation of elevated coronary calcium score. He was seen by Dr. Lenord Fellers 01/2022. He was on rosuvastatin 5 mg daily. He had a coronary CT 03/2022 revealing a coronary calcium score of 26.4, which was 34th percentile. Calcifications noted in the left main. Also noted to have an ascending aortic aneurysm up to 46 mm .  He noted some occasional chest discomfort when lying in bed at night but had no exertional symptoms.  He did have some mild lower extremity edema.  Home blood pressures were mildly elevated and he was started on carvedilol, especially given his ascending aortic aneurysm.  He had myalgias on rosuvastatin and was switched to atorvastatin.  He followed up with Luther Parody Walker/2024 and noted that his leg cramps have reduced.  Blood pressure remained  uncontrolled and losartan was switched to olmesartan.  Blood pressure improved at follow-up.  Echo 11/2022 revealed LVEF 55-60% with indeterminate diastolic function.  Ascending aorta was 4.8 cm.   At his last appointment he reported an increase in his energy and a decrease in chest pain.  He did continue to have exertional dyspnea and his aorta was noted to be more enlarged than previously.  Coronary CTA revealed minimal plaque in the LAD and RCA.  The aortic root was 4.1 cm.  The entire aorta was not well-visualized so a chest CTA was obtained which revealed that the aorta was only 4.1 cm, not 4.8 cm as previously noted on echo.  He was also noted to have hepatic steatosis and a right thyroid nodule.  He noted that blood pressures at home are running in the 150s over 90s.  Amlodipine was added.  Today, he states that physically, some days are much better than others. He believes this may be related to his sleep quality the night before. He frequently wakes up in the middle of the night, sometimes needing to catch his breath. He then struggles to fall back asleep. Usually he complains of nocturia 2-3 times a night. Yesterday he completed an hour of yard work and was subsequently very fatigued. He is able to complete up to a 3 mile walk twice a week. During the last mile he is fatigued and short of breath. He reports that his home blood pressures seem to be trending down. He presents a BP log via MyChart which is personally reviewed.  Since October 4th he has been taking two 10 mg tablets for a total of 20 mg atorvastatin. He denies any palpitations, chest pain, peripheral edema, lightheadedness, headaches, syncope.  ROS:  Please see the history of present illness. All other systems are reviewed and negative.  (+) Exertional fatigue and shortness of breath (+) PND (+) Insomnia  Studies Reviewed: Marland Kitchen        Chest CT-A 01/2023: IMPRESSION: 1. Slight dilatation of the aortic root and ascending  segment measuring 4.1 cm and 4 cm, respectively. No dissection or hematoma. Recommend annual imaging followup by CTA or MRA. This recommendation follows 2010 ACCF/AHA/AATS/ACR/ASA/SCA/SCAI/SIR/STS/ SVM Guidelines for the Diagnosis and Management of Patients with Thoracic Aortic Disease. Circulation. 2010; 121: X324-M010. Aortic aneurysm NOS (ICD10-I71.9). 2. Aortic soft plaques and trace single-vessel coronary artery atherosclerosis. 3. No acute chest CT or CTA findings. 4. Hepatic steatosis. 5. 1.1 cm right thyroid nodule.  No follow-up imaging recommended. 6. Osteopenia and degenerative changes of the spine. 7. Small hepatic cyst.   Coronary CT-A 12/2022: IMPRESSION: 1. Coronary calcium score of 8.70. This was 24 percentile for age-, sex, and race-matched controls.   2. Total plaque volume 58 mm3 which is 9 percentile for age- and sex-matched controls (calcified plaque 3 mm3; non-calcified plaque 55 mm3). TPV is (mild).   3. Normal coronary origin with right dominance.   4. Minimal (0-24) plaque in the LAD and RCA.   5. Dilated aortic root (41 mm).  Risk Assessment/Calculations:         STOP-Bang Score:  5      Physical Exam:    VS:  BP 122/86  Wt Readings from Last 3 Encounters:  12/18/22 215 lb 4.8 oz (97.7 kg)  09/13/22 218 lb (98.9 kg)  08/19/22 217 lb 12.8 oz (98.8 kg)    GENERAL: Well-appearing.  No acute distress. HEENT: Pupils equal round.  Oral mucosa unremarkable NECK:  No jugular venous distention, no visible thyromegaly EXT:  No edema, no cyanosis no clubbing SKIN:  No rashes no nodules NEURO:  Speech fluent.  Cranial nerves grossly intact.  Moves all 4 extremities freely PSYCH:  Cognitively intact, oriented to person place and time  ASSESSMENT AND PLAN: .    # Mild Aortic Aneurysm Stable at 4.1 cm based on recent CT scan. No functional issues identified. -Continue monitoring, avoid heavy lifting. -BP now well-controlled.  He is on a beta blocker.    # Hypertension Blood pressure readings have been decreasing. Now much better controlled.  Currently on Amlodipine 2.5mg , Carvedilol, and Olmesartan. -Continue current medications.  # Hyperlipidemia Currently on Atorvastatin 20mg  daily. -Continue Atorvastatin 20mg  daily. -Check lipid panel on 03/30/2023.  # Fatty Liver Identified on recent CT scan. No functional issues identified. -Recommend exercise, weight loss, and dietary changes. -Limit alcohol and Tylenol intake.  # Sleep Disturbance Reports inconsistent sleep and fatigue.  Known snoring and daytime somnolence.  -Order home sleep study to assess for sleep apnea.  # General Health Maintenance -Encourage increased cardiovascular exercise. -Check thyroid function if not recently done by primary care physician. -Follow-up in 1 year or sooner if adjustments needed based on December labs.      COVID-19 Education: The signs and symptoms of COVID-19 were discussed with the patient and how to seek care for testing (follow up with PCP or arrange E-visit).  The importance of social distancing was discussed today.  Time:   Today, I have spent 15 minutes with the patient with telehealth technology discussing the above  problems.     Dispo:  FU with Evalin Shawhan C. Duke Salvia, MD, [facility] in 1 year.  I,Mathew Stumpf,acting as a Neurosurgeon for Chilton Si, MD.,have documented all relevant documentation on the behalf of Chilton Si, MD,as directed by  Chilton Si, MD while in the presence of Chilton Si, MD.  I, Haidan Nhan C. Duke Salvia, MD have reviewed all documentation for this visit.  The documentation of the exam, diagnosis, procedures, and orders on 02/03/2023 are all accurate and complete.   Signed, Chilton Si, MD

## 2023-02-03 NOTE — Patient Instructions (Addendum)
Medication Instructions:  Your physician recommends that you continue on your current medications as directed. Please refer to the Current Medication list given to you today.   *If you need a refill on your cardiac medications before your next appointment, please call your pharmacy*  Lab Work: FASTING LP/CMET IN [place] OR EARLY JANUARY   If you have labs (blood work) drawn today and your tests are completely normal, you will receive your results only by: MyChart Message (if you have MyChart) OR A paper copy in the mail If you have any lab test that is abnormal or we need to change your treatment, we will call you to review the results.  Testing/Procedures: HOME ITAMAR SLEEP STUDY  WILL CALL YOU ONCE IT HAS BEEN APPROVED AND HAVE YOU COME PICK UP MACHINE  Follow-Up: At [facility], you and your health needs are our priority.  As part of our continuing mission to provide you with exceptional heart care, we have created designated Provider Care Teams.  These Care Teams include your primary Cardiologist (physician) and Advanced Practice Providers (APPs -  Physician Assistants and Nurse Practitioners) who all work together to provide you with the care you need, when you need it.  We recommend signing up for the patient portal called "MyChart".  Sign up information is provided on this After Visit Summary.  MyChart is used to connect with patients for Virtual Visits (Telemedicine).  Patients are able to view lab/test results, encounter notes, upcoming appointments, etc.  Non-urgent messages can be sent to your provider as well.   To learn more about what you can do with MyChart, go to [email or web address].    Your next appointment:   12 month(s)  Provider:   Chilton Si, MD or Gillian Shields, NP    Other Instructions WatchPAT?  Is a FDA cleared portable home sleep study test that uses a watch and 3 points of contact to monitor 7 different channels, including your heart  rate, oxygen saturations, body position, snoring, and chest motion.  The study is easy to use from the comfort of your own home and accurately detect sleep apnea.  Before bed, you attach the chest sensor, attached the sleep apnea bracelet to your nondominant hand, and attach the finger probe.  After the study, the raw data is downloaded from the watch and scored for apnea events.   For more information: [email or web address]  Patient Testing Instructions:  Do not put battery into the device until bedtime when you are ready to begin the test. Please call the support number if you need assistance after following the instructions below: 24 hour support line- [phone] or [facility] support at [phone] (option 2)  Download the IntelWatchPAT One" app through the google play store or App Store  Be sure to turn on or enable access to bluetooth in settlings on your smartphone/ device  Make sure no other bluetooth devices are on and within the vicinity of your smartphone/ device and WatchPAT watch during testing.  Make sure to leave your smart phone/ device plugged in and charging all night.  When ready for bed:  Follow the instructions step by step in the WatchPAT One App to activate the testing device. For additional instructions, including video instruction, visit the WatchPAT One video on Youtube. You can search for WatchPat One within Youtube (video is 4 minutes and 18 seconds) or enter: https://youtube/watch?[email or web address]_vbiwxE Please note: You will be prompted to enter a Pin to connect via bluetooth when starting the test. The PIN  will be assigned to you when you receive the test.  The device is disposable, but it recommended that you retain the device until you receive a call letting you know the study has been received and the results have been interpreted.  We will let you know if the study did not transmit to [place] properly after the test is completed. You do not need to call us to  confirm the receipt of the test.  Please complete the test within 48 hours of receiving PIN.   Frequently Asked Questions:  What is Watch Dennie Bible one?  A single use fully disposable home sleep apnea testing device and will not need to be returned after completion.  What are the requirements to use WatchPAT one?  The be able to have a successful watchpat one sleep study, you should have your Watch pat one device, your smart phone, watch pat one app, your PIN number and Internet access What type of phone do I need?  You should have a smart phone that uses Android 5.1 and above or any Iphone with IOS 10 and above How can I download the WatchPAT one app?  Based on your device type search for WatchPAT one app either in google play for android devices or APP store for Iphone's Where will I get my PIN for the study?  Your PIN will be provided by your physician's office. It is used for authentication and if you lose/forget your PIN, please reach out to your providers office.  I do not have Internet at home. Can I do WatchPAT one study?  WatchPAT One needs Internet connection throughout the night to be able to transmit the sleep data. You can use your home/local internet or your cellular's data package. However, it is always recommended to use home/local Internet. It is estimated that between 20MB-30MB will be used with each study.However, the application will be looking for space in the phone to start the study.  What happens if I lose internet or bluetooth connection?  During the internet disconnection, your phone will not be able to transmit the sleep data. All the data, will be stored in your phone. As soon as the internet connection is back on, the phone will being sending the sleep data. During the bluetooth disconnection, WatchPAT one will not be able to to send the sleep data to your phone. Data will be kept in the [facility] one until two devices have bluetooth connection back on. As soon as the  connection is back on, WatchPAT one will send the sleep data to the phone.  How long do I need to wear the WatchPAT one?  After you start the study, you should wear the device at least 6 hours.  How far should I keep my phone from the device?  During the night, your phone should be within 15 feet.  What happens if I leave the room for restroom or other reasons?  Leaving the room for any reason will not cause any problem. As soon as your get back to the room, both devices will reconnect and will continue to send the sleep data. Can I use my phone during the sleep study?  Yes, you can use your phone as usual during the study. But it is recommended to put your watchpat one on when you are ready to go to bed.  How will I get my study results?  A soon as you completed your study, your sleep data will be sent to the provider. They  will then share the results with you when they are ready.

## 2023-02-27 ENCOUNTER — Other Ambulatory Visit (HOSPITAL_BASED_OUTPATIENT_CLINIC_OR_DEPARTMENT_OTHER): Payer: Self-pay | Admitting: Cardiovascular Disease

## 2023-03-10 NOTE — Progress Notes (Signed)
Patient Care Team: Melvin Mackintosh, MD as PCP - General (Internal Medicine) Chilton Si, MD as PCP - Cardiology (Cardiology)  Visit Date: 03/17/23  Subjective:    Patient ID: Melvin Devoid, PhD , Male   DOB: 1955/05/21, 67 y.o.    MRN: [id number]   67 y.o. Male presents today for a 5-month checkup.  He has an upcoming 7-month-long trip to [place] and plans to move to [place] full-time at some point soon after that trip.  History of hyperlipidemia treated with atorvastatin 20 mg daily. Lipid panel normal on 01/27/23.  History of hypertension treated with amlodipine 2.5 mg daily, carvedilol 3.125 mg twice daily, olmesartan 40 mg daily. Followed by Dr. Chilton Si. Blood pressure normal in-office today at 130/80.  History of myopia treated with corrective lenses.   History of posterior vitreous detachment.   History of BPH treated with Proscar, tamsulosin.  History of GERD treated with esomeprazole 40 mg daily.    History of B12 deficiency treated with monthly self administered B12 injections.   History of allergic rhinitis and asthma treated with albuterol inhaler as needed, Breo Ellipta inhaler, Singular, Zyrtec.  History of mild memory loss in the remote past and has been seen at [facility].  Past Medical History:  Diagnosis Date   Allergy    Anxiety    2 years ago no longer on meds  Zoloft   Arthritis    osteoarthritis   Ascending aortic aneurysm (HCC) 06/06/2022   Asthma    Basal cell carcinoma    CAD in native artery 07/12/2022   Cancer (HCC)    Hyperlipidemia    Hypertension    Memory loss    Pure hypercholesterolemia 06/06/2022   Vision abnormalities    migraines     Family History  Problem Relation Age of Onset   Mental illness Mother    Dementia Mother    Heart attack Father 66   Heart disease Father    Diabetes Father    Asthma Sister    Lung cancer Sister    Hypertension Brother    Colon cancer Maternal Grandmother    Colon  polyps Neg Hx    Esophageal cancer Neg Hx    Rectal cancer Neg Hx    Stomach cancer Neg Hx     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 Planning a trip to [place] with his wife in the near future. Would like to live in [place] eventually. Nonsmoker.     Review of Systems  Constitutional:  Negative for fever and malaise/fatigue.  HENT:  Negative for congestion.   Eyes:  Negative for blurred vision.  Respiratory:  Negative for cough and shortness of breath.   Cardiovascular:  Negative for chest pain, palpitations and leg swelling.  Gastrointestinal:  Negative for vomiting.  Musculoskeletal:  Negative for back pain.  Skin:  Negative for rash.  Neurological:  Negative for loss of consciousness and headaches.        Objective:   Vitals: BP 130/80   Pulse 62   Ht 6\' 2"  (1.88 m)   Wt 213 lb (96.6 kg)   SpO2 98%   BMI 27.35 kg/m    Physical Exam Constitutional:      General: He is not in acute distress.    Appearance: Normal appearance. He is not ill-appearing.  HENT:     Head: Normocephalic and atraumatic.  Cardiovascular:     Rate and Rhythm: Normal rate and regular  rhythm.     Pulses: Normal pulses.     Heart sounds: Normal heart sounds. No murmur heard.    No friction rub. No gallop.  Pulmonary:     Effort: Pulmonary effort is normal. No respiratory distress.     Breath sounds: Normal breath sounds. No wheezing or rales.  Skin:    General: Skin is warm and dry.  Neurological:     Mental Status: He is alert and oriented to person, place, and time. Mental status is at baseline.  Psychiatric:        Mood and Affect: Mood normal.        Behavior: Behavior normal.        Thought Content: Thought content normal.        Judgment: Judgment normal.       Results:   Studies obtained and personally reviewed by me:   Labs:       Component Value Date/Time   NA 141 12/23/2022 0917   K 4.6 12/23/2022 0917   CL 104 12/23/2022 0917    CO2 25 12/23/2022 0917   GLUCOSE 96 12/23/2022 0917   GLUCOSE 99 08/13/2022 0921   BUN 16 12/23/2022 0917   CREATININE 1.03 12/23/2022 0917   CREATININE 1.20 08/13/2022 0921   CALCIUM 8.8 12/23/2022 0917   PROT 6.2 01/27/2023 0925   ALBUMIN 4.4 01/27/2023 0925   AST 22 01/27/2023 0925   ALT 27 01/27/2023 0925   ALKPHOS 73 01/27/2023 0925   BILITOT 1.0 01/27/2023 0925   GFRNONAA 60 07/27/2020 0948   GFRAA 70 07/27/2020 0948     Lab Results  Component Value Date   WBC 4.0 08/13/2022   HGB 14.7 08/13/2022   HCT 42.4 08/13/2022   MCV 98.4 08/13/2022   PLT 115 (L) 08/13/2022    Lab Results  Component Value Date   CHOL 156 01/27/2023   HDL 51 01/27/2023   LDLCALC 85 01/27/2023   TRIG 111 01/27/2023   CHOLHDL 3.1 01/27/2023    Lab Results  Component Value Date   HGBA1C 5.1 03/29/2016     Lab Results  Component Value Date   TSH 1.230 10/04/2014     Lab Results  Component Value Date   PSA 0.62 08/02/2021   PSA 0.85 07/27/2020   PSA 1.6 07/27/2019      Assessment & Plan:   Hyperlipidemia: treated with atorvastatin 20 mg daily. Refilled. Lipid panel normal on 01/27/23.  Hypertension: treated with amlodipine 2.5 mg daily, carvedilol 3.125 mg twice daily, olmesartan 40 mg daily. Followed by Dr. Chilton Si. Blood pressure normal in-office today at 130/80. Refilled olmesartan, amlodipine, carvedilol.  BPH: treated with Proscar, tamsulosin.  GERD: treated with esomeprazole 40 mg daily. Refilled.  Allergic rhinitis and asthma: stable with Breo Ellipta, albuterol inhalers, Zyrtec, Flonase, montelukast. Refilled Breo, montelukast.    B12 deficiency: treated with monthly self administered B12 injections. Refilled.  Hypokalemia: refilled potassium chloride.  Mild memory loss-stable  Hx of myopia treated with corrective lenses.   Hx of posterior vitreous detachment.  Given  Zithromax Z-Pak for upcoming travel, Medrol Dosepak if needed during travel, Tessalon  perles per pt request and Hycodan to have for upcoming travel.  Vaccine counseling: UTD on high-dose flu, Covid-19, pneumococcal-20, tetanus, RSV vaccines.  Will have Medicare wellness visit later this week.  I,[name],acting as a Neurosurgeon for Melvin Mackintosh, MD.,have documented all relevant documentation on the behalf of Melvin Mackintosh, MD,as directed by  Melvin Mackintosh, MD while  in the presence of Melvin Mackintosh, MD.   I, Melvin Mackintosh, MD, have reviewed all documentation for this visit. The documentation on 03/17/23 for the exam, diagnosis, procedures, and orders are all accurate and complete.

## 2023-03-17 ENCOUNTER — Encounter: Payer: Self-pay | Admitting: Internal Medicine

## 2023-03-17 ENCOUNTER — Ambulatory Visit: Payer: Medicare PPO | Admitting: Internal Medicine

## 2023-03-17 VITALS — BP 130/80 | HR 62 | Ht 74.0 in | Wt 213.0 lb

## 2023-03-17 DIAGNOSIS — J3089 Other allergic rhinitis: Secondary | ICD-10-CM

## 2023-03-17 DIAGNOSIS — Z8709 Personal history of other diseases of the respiratory system: Secondary | ICD-10-CM | POA: Diagnosis not present

## 2023-03-17 DIAGNOSIS — E538 Deficiency of other specified B group vitamins: Secondary | ICD-10-CM

## 2023-03-17 DIAGNOSIS — N401 Enlarged prostate with lower urinary tract symptoms: Secondary | ICD-10-CM

## 2023-03-17 DIAGNOSIS — E7800 Pure hypercholesterolemia, unspecified: Secondary | ICD-10-CM

## 2023-03-17 DIAGNOSIS — R413 Other amnesia: Secondary | ICD-10-CM

## 2023-03-17 DIAGNOSIS — R351 Nocturia: Secondary | ICD-10-CM

## 2023-03-17 DIAGNOSIS — K219 Gastro-esophageal reflux disease without esophagitis: Secondary | ICD-10-CM

## 2023-03-17 DIAGNOSIS — I10 Essential (primary) hypertension: Secondary | ICD-10-CM

## 2023-03-17 DIAGNOSIS — J301 Allergic rhinitis due to pollen: Secondary | ICD-10-CM

## 2023-03-17 MED ORDER — AMLODIPINE BESYLATE 2.5 MG PO TABS: 2.5000 mg | ORAL_TABLET | Freq: Every day | ORAL | 3 refills | Status: AC

## 2023-03-17 MED ORDER — HYDROCODONE BIT-HOMATROP MBR 5-1.5 MG/5ML PO SOLN: 5.0000 mL | Freq: Three times a day (TID) | ORAL | 0 refills | Status: AC | PRN

## 2023-03-17 MED ORDER — FINASTERIDE 5 MG PO TABS: 5.0000 mg | ORAL_TABLET | Freq: Every day | ORAL | 1 refills | Status: DC

## 2023-03-17 MED ORDER — FLUTICASONE FUROATE-VILANTEROL 100-25 MCG/ACT IN AEPB: 1.0000 | INHALATION_SPRAY | Freq: Every day | RESPIRATORY_TRACT | 1 refills | Status: DC

## 2023-03-17 MED ORDER — ESOMEPRAZOLE MAGNESIUM 40 MG PO CPDR: 40.0000 mg | DELAYED_RELEASE_CAPSULE | Freq: Every day | ORAL | 3 refills | Status: DC

## 2023-03-17 MED ORDER — AZITHROMYCIN 250 MG PO TABS: ORAL_TABLET | ORAL | 1 refills | Status: AC

## 2023-03-17 MED ORDER — METHYLPREDNISOLONE 4 MG PO TABS: ORAL_TABLET | ORAL | 1 refills | Status: AC

## 2023-03-17 MED ORDER — POTASSIUM CHLORIDE CRYS ER 20 MEQ PO TBCR: 20.0000 meq | EXTENDED_RELEASE_TABLET | Freq: Every day | ORAL | 2 refills | Status: AC

## 2023-03-17 MED ORDER — MONTELUKAST SODIUM 10 MG PO TABS: 10.0000 mg | ORAL_TABLET | Freq: Every day | ORAL | 3 refills | Status: DC

## 2023-03-17 MED ORDER — BENZONATATE 100 MG PO CAPS: 100.0000 mg | ORAL_CAPSULE | Freq: Two times a day (BID) | ORAL | 0 refills | Status: DC | PRN

## 2023-03-17 MED ORDER — CYANOCOBALAMIN 1000 MCG/ML IJ SOLN: 1000.0000 ug | INTRAMUSCULAR | 1 refills | Status: AC

## 2023-03-17 MED ORDER — TAMSULOSIN HCL 0.4 MG PO CAPS: 0.4000 mg | ORAL_CAPSULE | Freq: Every day | ORAL | 1 refills | Status: DC

## 2023-03-17 MED ORDER — ATORVASTATIN CALCIUM 20 MG PO TABS
20.0000 mg | ORAL_TABLET | Freq: Every day | ORAL | 3 refills | Status: DC
Start: 2023-03-17 — End: 2023-03-18

## 2023-03-17 MED ORDER — CARVEDILOL 3.125 MG PO TABS: 3.1250 mg | ORAL_TABLET | Freq: Two times a day (BID) | ORAL | 3 refills | Status: AC

## 2023-03-17 MED ORDER — OLMESARTAN MEDOXOMIL 40 MG PO TABS: 40.0000 mg | ORAL_TABLET | Freq: Every day | ORAL | 2 refills | Status: AC

## 2023-03-17 NOTE — Patient Instructions (Addendum)
Patient presents for 6 month check up. Meds refilled per pt. request.  Lipid and liver panels were normal in October. Follow up for Medicare wellness visit later this week.

## 2023-03-18 ENCOUNTER — Encounter: Payer: Self-pay | Admitting: Internal Medicine

## 2023-03-18 ENCOUNTER — Telehealth (HOSPITAL_BASED_OUTPATIENT_CLINIC_OR_DEPARTMENT_OTHER): Payer: Self-pay | Admitting: Cardiovascular Disease

## 2023-03-18 ENCOUNTER — Ambulatory Visit: Payer: Medicare PPO | Admitting: Internal Medicine

## 2023-03-18 VITALS — BP 100/70 | HR 68 | Ht 74.0 in | Wt 213.0 lb

## 2023-03-18 DIAGNOSIS — I10 Essential (primary) hypertension: Secondary | ICD-10-CM | POA: Diagnosis not present

## 2023-03-18 DIAGNOSIS — J301 Allergic rhinitis due to pollen: Secondary | ICD-10-CM

## 2023-03-18 DIAGNOSIS — H5213 Myopia, bilateral: Secondary | ICD-10-CM

## 2023-03-18 DIAGNOSIS — Z0000 Encounter for general adult medical examination without abnormal findings: Secondary | ICD-10-CM | POA: Diagnosis not present

## 2023-03-18 DIAGNOSIS — Z8709 Personal history of other diseases of the respiratory system: Secondary | ICD-10-CM

## 2023-03-18 DIAGNOSIS — E7800 Pure hypercholesterolemia, unspecified: Secondary | ICD-10-CM | POA: Diagnosis not present

## 2023-03-18 DIAGNOSIS — N486 Induration penis plastica: Secondary | ICD-10-CM

## 2023-03-18 DIAGNOSIS — N401 Enlarged prostate with lower urinary tract symptoms: Secondary | ICD-10-CM | POA: Diagnosis not present

## 2023-03-18 DIAGNOSIS — R413 Other amnesia: Secondary | ICD-10-CM

## 2023-03-18 DIAGNOSIS — K219 Gastro-esophageal reflux disease without esophagitis: Secondary | ICD-10-CM

## 2023-03-18 DIAGNOSIS — R351 Nocturia: Secondary | ICD-10-CM

## 2023-03-18 DIAGNOSIS — J3089 Other allergic rhinitis: Secondary | ICD-10-CM

## 2023-03-18 DIAGNOSIS — E538 Deficiency of other specified B group vitamins: Secondary | ICD-10-CM | POA: Diagnosis not present

## 2023-03-18 LAB — HEPATIC FUNCTION PANEL
ALT: 27 [IU]/L (ref 0–44)
AST: 22 [IU]/L (ref 0–40)
Albumin: 4.3 g/dL (ref 3.9–4.9)
Alkaline Phosphatase: 74 [IU]/L (ref 44–121)
Bilirubin Total: 0.8 mg/dL (ref 0.0–1.2)
Bilirubin, Direct: 0.25 mg/dL (ref 0.00–0.40)
Total Protein: 6.2 g/dL (ref 6.0–8.5)

## 2023-03-18 LAB — LIPID PANEL
Chol/HDL Ratio: 3 ratio (ref 0.0–5.0)
Cholesterol, Total: 163 mg/dL (ref 100–199)
HDL: 54 mg/dL
LDL Chol Calc (NIH): 85 mg/dL (ref 0–99)
Triglycerides: 135 mg/dL (ref 0–149)
VLDL Cholesterol Cal: 24 mg/dL (ref 5–40)

## 2023-03-18 MED ORDER — ATORVASTATIN CALCIUM 40 MG PO TABS
40.0000 mg | ORAL_TABLET | Freq: Every day | ORAL | 3 refills | Status: DC
Start: 2023-03-18 — End: 2023-08-25

## 2023-03-18 NOTE — Telephone Encounter (Signed)
  Per MyChart scheduling message:  Patient is requesting to have his lipid levels checked since his medications were changed at last visit. Please advise.

## 2023-03-18 NOTE — Progress Notes (Signed)
Annual Wellness Visit    Patient Care Team: Baxley, Luanna Cole, MD as PCP - General (Internal Medicine) Chilton Si, MD as PCP - Cardiology (Cardiology)  Visit Date: 03/18/23   Chief Complaint  Patient presents with   Welcome to Medicare    Subjective:   Patient: Melvin Devoid, PhD, Male    DOB: 1956/03/15, 67 y.o.   MRN: [id number]  Melvin Devoid, PhD is a 67 y.o. Male who presents today for his Annual Wellness Visit.  He is feeling well regarding general health.  He has an upcoming 56-month-long trip to [place] and plans to move to [place] full-time at some point soon after that trip.   History of hyperlipidemia treated with atorvastatin 20 mg daily. Lipid panel normal on 03/17/23. Liver panel normal.    History of hypertension treated with amlodipine 2.5 mg daily, carvedilol 3.125 mg twice daily, olmesartan 40 mg daily. Followed by Dr. Chilton Si. Blood pressure normal in-office today at 100/70.  History of myopia treated with corrective lenses.   History of posterior vitreous detachment.   History of BPH treated with Proscar, tamsulosin.   History of GERD treated with esomeprazole 40 mg daily.    History of B12 deficiency treated with monthly self administered B12 injections.   History of allergic rhinitis and asthma treated with albuterol inhaler as needed, Breo Ellipta inhaler, Singular, Zyrtec.  History of upper lid blepharoplasty at [facility] in February 2021.  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  He wears corrective lenses.  History of cataracts.  History of posterior vitreous detachment left eye in January 2023.  This has improved he says.   Was diagnosed in the past with dysphonia.  This has improved.     History of Peyronie's disease seen at [facility]-just being observed and no treatment prescribed.   In 2018 he had an attack of severe vertigo.  He had low potassium at 3.3.  He was  evaluated in the emergency department and given potassium supplement during the evaluation and has not had recurrence of vertigo.   History of COVID-19 in the Spring of 2022.  History of right lower lobe pneumonia 1994.  Prostatitis March 2000, vasectomy 1993.  In May 2013 Dr. Londell Moh removed basal cell carcinoma from right sideburn area.  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  In January 2023 he had a protracted respiratory infection and respiratory virus panel was positive for both Adenovirus B and respiratory syncytial virus A.   Has been seen at [facility] for memory loss in the remote past.  He was tried on Zoloft by Dr. Alois Cliche for possible depression without much improvement.  He later found that insurance did not cover visits there.  Has received immunotherapy for allergic rhinitis prior to the pandemic but not recently and does not seem to be bothered by allergies much at the present time.  Has seen Dr. Kendrick Fries in the past regarding mild persistent asthma in 2018.   In 2018 when being evaluated for vertigo in the emergency department EKG showed possible intraventricular conduction delay.  His potassium was slightly low at 3.3 and we subsequently compared his EKG at that time with his EKG from 2009 and his paper record and there were similar findings.  Had colonoscopy in 2018 and repeat study due 2028.    Social history: He is married.  Wife has her own consulting business.  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  He has 2 daughters from a previous marriage.  He lives in [place].  Non-smoker.  Social alcohol consumption but not very often.   Family history: History of dementia in his mother. Father died at age 27 after several heart attacks. 1 brother in good health. 6 sisters. Both parents had diabetes. Father had hypertension. 1 sibling with seizure disorder. 1 sister died of lung cancer with history of smoking.    Past Medical History:  Diagnosis Date   Allergy    Anxiety    2 years ago no longer on meds  Zoloft   Arthritis    osteoarthritis   Ascending aortic aneurysm (HCC) 06/06/2022   Asthma    Basal cell carcinoma    CAD in native artery 07/12/2022   Cancer (HCC)    Hyperlipidemia    Hypertension    Memory loss    Pure hypercholesterolemia 06/06/2022   Vision abnormalities    migraines     Family History  Problem Relation Age of Onset   Mental illness Mother    Dementia Mother    Heart attack Father 30   Heart disease Father    Diabetes Father    Asthma Sister    Lung cancer Sister    Hypertension Brother    Colon cancer Maternal Grandmother    Colon polyps Neg Hx    Esophageal cancer Neg Hx    Rectal cancer Neg Hx    Stomach cancer Neg Hx      Recently retired from [employer] where he taught physiology for many years. Married. Planning an extended [place] vacation and may decide to live there permanently    Review of Systems  Constitutional:  Negative for chills, fever, malaise/fatigue and weight loss.  HENT:  Negative for hearing loss, sinus pain and sore throat.   Respiratory:  Negative for cough, hemoptysis and shortness of breath.   Cardiovascular:  Negative for chest pain, palpitations, leg swelling and PND.  Gastrointestinal:  Negative for abdominal pain, constipation, diarrhea, heartburn, nausea and vomiting.  Genitourinary:  Negative for dysuria, frequency and urgency.  Musculoskeletal:  Negative for back pain, myalgias and neck pain.  Skin:  Negative for itching and rash.  Neurological:  Negative for dizziness, tingling, seizures and headaches.  Endo/Heme/Allergies:  Negative for polydipsia.  Psychiatric/Behavioral:  Negative for depression. The patient is not nervous/anxious.       Objective:   Vitals: BP 100/70   Pulse 68   Ht 6\' 2"  (1.88 m)   Wt 213 lb (96.6 kg)   SpO2 98%   BMI 27.35 kg/m   Physical Exam Vitals and nursing note reviewed.   Constitutional:      General: He is awake. He is not in acute distress.    Appearance: Normal appearance. He is not ill-appearing or toxic-appearing.  HENT:     Head: Normocephalic and atraumatic.     Right Ear: Hearing, tympanic membrane, ear canal and external ear normal.     Left Ear: Hearing, tympanic membrane, ear canal and external ear normal.     Mouth/Throat:     Pharynx: Oropharynx is clear.  Eyes:     Extraocular Movements: Extraocular movements intact.     Pupils: Pupils are equal, round, and reactive to light.  Neck:     Thyroid: No thyroid mass, thyromegaly or thyroid tenderness.     Vascular: No carotid bruit.  Cardiovascular:     Rate and Rhythm: Normal rate and  regular rhythm. No extrasystoles are present.    Pulses:          Dorsalis pedis pulses are 1+ on the right side and 1+ on the left side.     Heart sounds: Normal heart sounds. No murmur heard.    No friction rub. No gallop.  Pulmonary:     Effort: Pulmonary effort is normal.     Breath sounds: Normal breath sounds. No decreased breath sounds, wheezing, rhonchi or rales.  Chest:     Chest wall: No mass.  Abdominal:     Palpations: Abdomen is soft. There is no hepatomegaly, splenomegaly or mass.     Tenderness: There is no abdominal tenderness.     Hernia: No hernia is present.  Musculoskeletal:     Cervical back: Normal range of motion.     Right lower leg: No edema.     Left lower leg: No edema.  Lymphadenopathy:     Cervical: No cervical adenopathy.     Upper Body:     Right upper body: No supraclavicular adenopathy.     Left upper body: No supraclavicular adenopathy.  Skin:    General: Skin is warm and dry.  Neurological:     General: No focal deficit present.     Mental Status: He is alert and oriented to person, place, and time. Mental status is at baseline.     Cranial Nerves: Cranial nerves 2-12 are intact.     Sensory: Sensation is intact.     Motor: Motor function is intact.      Coordination: Coordination is intact.     Gait: Gait is intact.     Deep Tendon Reflexes: Reflexes are normal and symmetric.  Psychiatric:        Attention and Perception: Attention normal.        Mood and Affect: Mood normal.        Speech: Speech normal.        Behavior: Behavior normal. Behavior is cooperative.        Thought Content: Thought content normal.        Cognition and Memory: Cognition and memory normal.        Judgment: Judgment normal.      Most recent functional status assessment:    03/18/2023   10:09 AM  In your present state of health, do you have any difficulty performing the following activities:  Hearing? 0  Vision? 0  Difficulty concentrating or making decisions? 0  Walking or climbing stairs? 0  Dressing or bathing? 0  Doing errands, shopping? 0  Preparing Food and eating ? N  Using the Toilet? N  In the past six months, have you accidently leaked urine? N  Do you have problems with loss of bowel control? N  Managing your Medications? N  Managing your Finances? N  Housekeeping or managing your Housekeeping? N   Most recent fall risk assessment:    03/18/2023   10:10 AM  Fall Risk   Falls in the past year? 0  Number falls in past yr: 0  Injury with Fall? 0  Risk for fall due to : No Fall Risks  Follow up Education provided;Falls prevention discussed;Falls evaluation completed    Most recent depression screenings:    03/18/2023   10:08 AM 08/19/2022   11:03 AM  PHQ 2/9 Scores  PHQ - 2 Score 0 0   Most recent cognitive screening:    03/18/2023   10:13 AM  6CIT Screen  What Year? 0 points  What month? 0 points  What time? 0 points  Count back from 20 0 points  Months in reverse 0 points  Repeat phrase 0 points  Total Score 0 points     Results:   Studies obtained and personally reviewed by me:  Had colonoscopy in 2018 and repeat study due 2028.   Labs:       Component Value Date/Time   NA 141 12/23/2022 0917   K 4.6  12/23/2022 0917   CL 104 12/23/2022 0917   CO2 25 12/23/2022 0917   GLUCOSE 96 12/23/2022 0917   GLUCOSE 99 08/13/2022 0921   BUN 16 12/23/2022 0917   CREATININE 1.03 12/23/2022 0917   CREATININE 1.20 08/13/2022 0921   CALCIUM 8.8 12/23/2022 0917   PROT 6.2 03/17/2023 0803   ALBUMIN 4.3 03/17/2023 0803   AST 22 03/17/2023 0803   ALT 27 03/17/2023 0803   ALKPHOS 74 03/17/2023 0803   BILITOT 0.8 03/17/2023 0803   GFRNONAA 60 07/27/2020 0948   GFRAA 70 07/27/2020 0948     Lab Results  Component Value Date   WBC 4.0 08/13/2022   HGB 14.7 08/13/2022   HCT 42.4 08/13/2022   MCV 98.4 08/13/2022   PLT 115 (L) 08/13/2022    Lab Results  Component Value Date   CHOL 163 03/17/2023   HDL 54 03/17/2023   LDLCALC 85 03/17/2023   TRIG 135 03/17/2023   CHOLHDL 3.0 03/17/2023    Lab Results  Component Value Date   HGBA1C 5.1 03/29/2016     Lab Results  Component Value Date   TSH 1.230 10/04/2014     Lab Results  Component Value Date   PSA 0.62 08/02/2021   PSA 0.85 07/27/2020   PSA 1.6 07/27/2019    Assessment & Plan:   Hyperlipidemia: treated with atorvastatin 20 mg daily. Lipid panel normal on 03/17/23. Liver panel normal.    Hypertension: treated with amlodipine 2.5 mg daily, carvedilol 3.125 mg twice daily, olmesartan 40 mg daily. Followed by Dr. Chilton Si. Blood pressure normal in-office today at 100/70.   BPH: treated with Proscar, tamsulosin.   GERD: treated with esomeprazole 40 mg daily.    B12 deficiency: treated with monthly self administered B12 injections.   Allergic rhinitis and asthma: treated with albuterol inhaler as needed, Breo Ellipta inhaler, Singular, Zyrtec.  Myopia: treated with corrective lenses.   History of posterior vitreous detachment.  Mild memory loss- stable, does not seem to have progressed   Had colonoscopy in 2018 and repeat study due 2028.   EKG today showed normal sinus rhythm.  Prostate exam deferred.  Vaccine  counseling: UTD on high-dose flu, Covid-19, pneumococcal-20, tetanus, RSV vaccines.   Return in 1 year or as needed.     Annual wellness visit done today including the all of the following: Reviewed patient's Family Medical History Reviewed and updated list of patient's medical providers Assessment of cognitive impairment was done Assessed patient's functional ability Established a written schedule for health screening services Health Risk Assessent Completed and Reviewed  Discussed health benefits of physical activity, and encouraged him to engage in regular exercise appropriate for his age and condition.        I,[name],acting as a Neurosurgeon for Margaree Mackintosh, MD.,have documented all relevant documentation on the behalf of Margaree Mackintosh, MD,as directed by  Margaree Mackintosh, MD while in the presence of Margaree Mackintosh, MD.   I, Margaree Mackintosh, MD, have  reviewed all documentation for this visit. The documentation on 03/31/23 for the exam, diagnosis, procedures, and orders are all accurate and complete.

## 2023-03-18 NOTE — Addendum Note (Signed)
Addended by: Alver Sorrow on: 03/18/2023 04:20 PM   Modules accepted: Orders

## 2023-03-19 ENCOUNTER — Encounter: Payer: BC Managed Care – PPO | Admitting: Internal Medicine

## 2023-03-20 ENCOUNTER — Ambulatory Visit: Payer: Self-pay | Admitting: Internal Medicine

## 2023-03-20 ENCOUNTER — Other Ambulatory Visit: Payer: Self-pay

## 2023-03-20 MED ORDER — FLUTICASONE FUROATE-VILANTEROL 100-25 MCG/ACT IN AEPB: 1.0000 | INHALATION_SPRAY | Freq: Every day | RESPIRATORY_TRACT | 1 refills | Status: DC

## 2023-03-20 NOTE — Telephone Encounter (Signed)
Triage team - okay to cancel Itamar. TY!  Alver Sorrow, NP

## 2023-03-20 NOTE — Telephone Encounter (Signed)
Copied from CRM [id number]. Topic: Clinical - Medication Question >> Mar 20, 2023 10:03 AM Louie Casa B wrote: Reason for CRM:  [facility] calling the need rx resent with 90 day supply of BREO

## 2023-03-20 NOTE — Progress Notes (Signed)
Subjective:    Melvin Devoid, PhD is a 67 y.o. Male who presents for a Welcome to Medicare exam.   Cardiac Risk Factors include: advanced age (>4men, >73 women);hypertension;dyslipidemia     Objective:    Today's Vitals   03/18/23 1010 03/18/23 1015  BP: 100/70   Pulse: 68   SpO2: 98%   Weight: 213 lb (96.6 kg)   Height: 6\' 2"  (1.88 m)   PainSc: 3  3   PainLoc: Generalized    Body mass index is 27.35 kg/m.  Medications Outpatient Encounter Medications as of 03/18/2023  Medication Sig   acetaminophen (TYLENOL) 500 MG tablet Take 500 mg by mouth as needed.   albuterol (VENTOLIN HFA) 108 (90 Base) MCG/ACT inhaler INHALE 2 PUFFS INTO THE LUNGS EVERY 6 HOURS AS NEEDED FOR WHEEZING   amLODipine (NORVASC) 2.5 MG tablet Take 1 tablet (2.5 mg total) by mouth daily.   azithromycin (ZITHROMAX) 250 MG tablet Take 2 tablets on day 1, then 1 tablet daily on days 2 through 5   benzonatate (TESSALON) 100 MG capsule Take 1 capsule (100 mg total) by mouth 2 (two) times daily as needed for cough.   carvedilol (COREG) 3.125 MG tablet Take 1 tablet (3.125 mg total) by mouth 2 (two) times daily.   cetirizine (ZYRTEC) 10 MG tablet Take 10 mg by mouth daily.   cyanocobalamin (VITAMIN B12) 1000 MCG/ML injection Inject 1 mL (1,000 mcg total) into the muscle every 30 (thirty) days.   esomeprazole (NEXIUM) 40 MG capsule Take 1 capsule (40 mg total) by mouth daily.   finasteride (PROSCAR) 5 MG tablet Take 1 tablet (5 mg total) by mouth daily.   fluticasone (FLONASE) 50 MCG/ACT nasal spray USE 2 SPRAYS INTO EACH NOSTRIL TWICE DAILY   HYDROcodone bit-homatropine (HYCODAN) 5-1.5 MG/5ML syrup Take 5 mLs by mouth every 8 (eight) hours as needed for cough.   methylPREDNISolone (MEDROL) 4 MG tablet Take in tapering course as directed for bronchospasm: 6 tabs by mouth on day one and decrease by one tab daily 6-5-4-3-2-1 taper   montelukast (SINGULAIR) 10 MG tablet Take 1 tablet (10 mg total) by mouth at bedtime.    olmesartan (BENICAR) 40 MG tablet Take 1 tablet (40 mg total) by mouth daily.   potassium chloride SA (KLOR-CON M) 20 MEQ tablet Take 1 tablet (20 mEq total) by mouth daily.   tamsulosin (FLOMAX) 0.4 MG CAPS capsule Take 1 capsule (0.4 mg total) by mouth daily.   [DISCONTINUED] atorvastatin (LIPITOR) 20 MG tablet Take 1 tablet (20 mg total) by mouth daily.   [DISCONTINUED] fluticasone furoate-vilanterol (BREO ELLIPTA) 100-25 MCG/ACT AEPB Inhale 1 puff into the lungs daily.   Facility-Administered Encounter Medications as of 03/18/2023  Medication   0.9 %  sodium chloride infusion     History: Past Medical History:  Diagnosis Date   Allergy    Anxiety    2 years ago no longer on meds  Zoloft   Arthritis    osteoarthritis   Ascending aortic aneurysm (HCC) 06/06/2022   Asthma    Basal cell carcinoma    CAD in native artery 07/12/2022   Cancer (HCC)    Hyperlipidemia    Hypertension    Memory loss    Pure hypercholesterolemia 06/06/2022   Vision abnormalities    migraines   Past Surgical History:  Procedure Laterality Date   KNEE ARTHROSCOPY W/ MENISCAL REPAIR Right     Family History  Problem Relation Age of Onset   Mental illness  Mother    Dementia Mother    Heart attack Father 25   Heart disease Father    Diabetes Father    Asthma Sister    Lung cancer Sister    Hypertension Brother    Colon cancer Maternal Grandmother    Colon polyps Neg Hx    Esophageal cancer Neg Hx    Rectal cancer Neg Hx    Stomach cancer Neg Hx    Social Hx: unchanged. 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 Patient has mild memory loss that is stable. He is planning vacation in [place] and ultimately plans to retire there.   Tobacco Counseling Nonsmoker   Immunizations and Health Maintenance Immunization History  Administered Date(s) Administered   Covid-19 Iv Non-us Vaccine (Bibp, Sinopharm) 12/23/2022   Fluad Trivalent(High Dose 65+) 12/18/2022    Influenza,inj,Quad PF,6+ Mos 12/16/2014, 01/14/2017, 02/12/2018, 12/22/2018, 01/13/2020   Influenza-Unspecified 12/17/2018, 01/11/2021, 01/06/2022   PFIZER(Purple Top)SARS-COV-2 Vaccination 06/19/2019, 07/10/2019, 01/13/2020, 07/13/2020, 01/11/2021   PNEUMOCOCCAL CONJUGATE-20 08/07/2021   Pfizer Covid-19 Vaccine Bivalent Booster 36yrs & up 08/04/2021, 12/23/2022   Pfizer(Comirnaty)Fall Seasonal Vaccine 12 years and older 12/23/2022   Respiratory Syncytial Virus Vaccine,Recomb Aduvanted(Arexvy) 02/10/2022   Tdap 11/14/2009, 09/13/2022   Unspecified SARS-COV-2 Vaccination 01/06/2022   Zoster Recombinant(Shingrix) 11/30/2016, 05/04/2017   Health Maintenance Due  Topic Date Due   COVID-19 Vaccine (9 - 2023-24 season) 02/17/2023    Activities of Daily Living    03/18/2023   10:09 AM  In your present state of health, do you have any difficulty performing the following activities:  Hearing? 0  Vision? 0  Difficulty concentrating or making decisions? 0  Walking or climbing stairs? 0  Dressing or bathing? 0  Doing errands, shopping? 0  Preparing Food and eating ? N  Using the Toilet? N  In the past six months, have you accidently leaked urine? N  Do you have problems with loss of bowel control? N  Managing your Medications? N  Managing your Finances? N  Housekeeping or managing your Housekeeping? N    Physical Exam   Physical Exam (optional), or other factors deemed appropriate based on the beneficiary's medical and social history and current clinical standards.   Advanced Directives:     EKG:  normal EKG, normal sinus rhythm, unchanged from previous tracings, normal sinus rhythm     Assessment:    This is a routine wellness  examination for this patient .   Vision/Hearing screen No results found.   Goals   None      Depression Screen    03/18/2023   10:08 AM 08/19/2022   11:03 AM 02/07/2022    9:22 AM 02/01/2021   10:45 AM  PHQ 2/9 Scores  PHQ - 2 Score 0 0 2 0   PHQ- 9 Score   3      Fall Risk    03/18/2023   10:10 AM  Fall Risk   Falls in the past year? 0  Number falls in past yr: 0  Injury with Fall? 0  Risk for fall due to : No Fall Risks  Follow up Education provided;Falls prevention discussed;Falls evaluation completed    Cognitive Function      11/03/2014   10:00 AM  Montreal Cognitive Assessment   Visuospatial/ Executive (0/5) 5  Naming (0/3) 3  Attention: Read list of digits (0/2) 2  Attention: Read list of letters (0/1) 1  Attention: Serial 7 subtraction starting at 100 (0/3) 3  Language: Repeat  phrase (0/2) 2  Language : Fluency (0/1) 1  Abstraction (0/2) 2  Delayed Recall (0/5) 4  Orientation (0/6) 6  Total 29  Adjusted Score (based on education) 29      03/18/2023   10:13 AM  6CIT Screen  What Year? 0 points  What month? 0 points  What time? 0 points  Count back from 20 0 points  Months in reverse 0 points  Repeat phrase 0 points  Total Score 0 points    Patient Care Team: Margaree Mackintosh, MD as PCP - General (Internal Medicine) Chilton Si, MD as PCP - Cardiology (Cardiology)     Plan:     I have personally reviewed and noted the following in the patient's chart:   Medical and social history Use of alcohol, tobacco or illicit drugs  Current medications and supplements Functional ability and status Nutritional status Physical activity Advanced directives List of other physicians Hospitalizations, surgeries, and ER visits in previous 12 months Vitals Screenings to include cognitive, depression, and falls Referrals and appointments  In addition, I have reviewed and discussed with patient certain preventive protocols, quality metrics, and best practice recommendations. A written personalized care plan for preventive services as well as general preventive health recommendations were provided to patient.     Araceli Sharlyne Cai, CMA 03/20/2023  I, Margaree Mackintosh, MD, have reviewed all  documentation for this visit. The documentation on 03/31/23 for the exam, diagnosis, procedures, and orders are all accurate and complete.

## 2023-03-20 NOTE — Telephone Encounter (Addendum)
Patient has 20 days left .  He denies any symptoms. He is needing a 90 dayd supply.  Reason for CRM:  [facility] calling the need rx resent with 90 day supply of BREO  Chief Complaint:Request for :[facility] calling the need prescription  resent with 90 day supply of BREO   Disposition: [] ED /[] Urgent Care (no appt availability in office) / [] Appointment(In office/virtual)/ []  [place]/ [] Home Care/ [] Refused Recommended Disposition /[] [facility]/ [x]  Follow-up with PCP Additional   fluticasone furoate-vilanterol (BREO ELLIPTA) 100-25 MCG/ACT AEPB On Chart Dose: 1 puff Inhale 1 puff into the lungs daily. 03/17/2023      Source: Local Medical RecordUpdated on: 03/18/2023   Months of dispense information: 4; Number of dispenses: 3 Dispense date: 02/27/2023 Qty: 60 each Unit strength: 100-25MCG Pharmacy: [facility] - [place], [place] - [address] [phone]  Fluticasone Propionate    Notes:

## 2023-03-20 NOTE — Telephone Encounter (Signed)
Patient requested to cancel itamar; ok per Luther Parody, NP.

## 2023-03-20 NOTE — Telephone Encounter (Signed)
Reason for Disposition . [1] Prescription refill request for ESSENTIAL medicine (i.e., likelihood of harm to patient if not taken) AND [2] triager unable to refill per department policy  Answer Assessment - Initial Assessment Questions 1. DRUG NAME: "What medicine do you need to have refilled?"      Breo  90 day supply needed 2. REFILLS REMAINING: "How many refills are remaining?" (Note: The label on the medicine or pill bottle will show how many refills are remaining. If there are no refills remaining, then a renewal may be needed.)     Need 90 day supply on this medication  4. PRESCRIBING HCP: "Who prescribed it?" Reason: If prescribed by specialist, call should be referred to that group.     [place]  Protocols used: Medication Refill and Renewal Call-A-AH

## 2023-03-20 NOTE — Addendum Note (Signed)
Addended by: Gregery Na on: 03/20/2023 11:52 AM   Modules accepted: Orders

## 2023-03-21 NOTE — Telephone Encounter (Signed)
Sleep study has been cancelled , per provider.

## 2023-04-05 ENCOUNTER — Encounter: Payer: Self-pay | Admitting: Internal Medicine

## 2023-04-07 NOTE — Telephone Encounter (Signed)
Called patient he is all set he got all he needed thru the teledoc, he is on his way to pharmacy now.

## 2023-08-13 NOTE — Progress Notes (Signed)
 Patient Care Team: Sylvan Evener, MD as PCP - General (Internal Medicine) Maudine Sos, MD as PCP - Cardiology (Cardiology)  Visit Date: 08/15/23  Subjective:   Chief Complaint  Patient presents with  . Follow-up    Medications   Vitals:   08/15/23 0934 08/15/23 1032  BP: (!) 134/90 138/88   Patient YQ:MVHQ Coltin, Haslem DOB:1955/10/02,68 y.o. ION:[id number]   68 y.o. Male presents today for 6 months follow-up for Hypertension and Pure Hypercholesterolemia. Patient has a past medical history of Myopia; Posterior Vitreous. Seen for his welcome to medicare visit on 03/18/2023.  Says that he has been having some back pain in her upper back and neck, which started after carrying a large container of cat litter upstairs.   Since he is living in [place] temporarily he requests some antibiotics and prescription cough medication to tide him over until [place].   History of Hypertension treated with Amlodipine  2.5 mg daily, Carvedilol  3.125 mg twice daily, and Olmesartan  40 mg daily. Blood Pressure: hypertensive today at 134/90, normalized to 138/88. Followed by Dr. Maudine Sos, Cardiologist. He says that his blood pressure is usually a little higher on his at-home device.  History of Pure Hypercholesterolemia treated with Atorvastatin  40 mg daily. 03/17/2023 Lipid Panel: WNL.  Reviewed 08/14/2023 C-MET, compared to 07/2022: Total Protein 6.0, decreased from 6.5; Globulin 1.8, decreased from 2.1.    History of Posterior Vitreous Detachment; Myopia treated with corrective lenses.   History of Benign Prostatic Hyperplasia treated with Flomax  daily and Proscar  5 mg daily.  History of GERD treated with Nexium  40 mg daily.  History of B12 Deficiency treated with self-administered B12 injections monthly.  History of Allergic Rhinitis; Asthma treated with Albuterol  inhaler as needed, Breo Ellipta  inhaler daily, Singulair  10 mg daily, and Zyrtec 10 mg daily.     History of Mild Memory Loss evaluated by [facility] in 2018, untreated, and has remained stable.   Past Medical History:  Diagnosis Date  . Allergy   . Anxiety    2 years ago no longer on meds  Zoloft   . Arthritis    osteoarthritis  . Ascending aortic aneurysm (HCC) 06/06/2022  . Asthma   . Basal cell carcinoma   . CAD in native artery 07/12/2022  . Cancer (HCC)   . Hyperlipidemia   . Hypertension   . Memory loss   . Pure hypercholesterolemia 06/06/2022  . Vision abnormalities    migraines  No Known Allergies  Family History  Problem Relation Age of Onset  . Mental illness Mother   . Dementia Mother   . Heart attack Father 65  . Heart disease Father   . Diabetes Father   . Asthma Sister   . Lung cancer Sister   . Hypertension Brother   . Colon cancer Maternal Grandmother   . Colon polyps Neg Hx   . Esophageal cancer Neg Hx   . Rectal cancer Neg Hx   . Stomach cancer Neg Hx    Social History   Social History Narrative  . Not on file  Has been living in [place], [place] temporarily. Moving again in June. Works as a Airline pilot.  Review of Systems  Constitutional:  Negative for fever and malaise/fatigue.  HENT:  Negative for congestion.   Eyes:  Negative for blurred vision.  Respiratory:  Negative for cough and shortness of breath.   Cardiovascular:  Negative for chest pain, palpitations and leg swelling.  Gastrointestinal:  Negative  for vomiting.  Musculoskeletal:  Positive for back pain (upper back) and neck pain (related to back pain).  Skin:  Negative for rash.  Neurological:  Negative for loss of consciousness and headaches.     Objective:  Vitals: BP 138/88 (Patient Position: Sitting, Cuff Size: Normal)   Pulse 66   Temp 97.8 F (36.6 C) (Temporal)   Ht 6' 1.5" (1.867 m)   Wt 212 lb 6.4 oz (96.3 kg)   SpO2 94%   BMI 27.64 kg/m   Physical Exam Constitutional:      General: He is not in acute distress.    Appearance: Normal appearance. He is  not ill-appearing.  HENT:     Head: Normocephalic and atraumatic.  Cardiovascular:     Rate and Rhythm: Normal rate and regular rhythm.     Pulses: Normal pulses.     Heart sounds: Normal heart sounds. No murmur heard.    No friction rub. No gallop.  Pulmonary:     Effort: Pulmonary effort is normal. No respiratory distress.     Breath sounds: Normal breath sounds. No wheezing or rales.  Skin:    General: Skin is warm and dry.  Neurological:     Mental Status: He is alert and oriented to person, place, and time. Mental status is at baseline.  Psychiatric:        Mood and Affect: Mood normal.        Behavior: Behavior normal.        Thought Content: Thought content normal.        Judgment: Judgment normal.    Results:  Studies Obtained And Personally Reviewed By Me: Labs:     Component Value Date/Time   NA 139 08/14/2023 0939   NA 141 12/23/2022 0917   K 4.3 08/14/2023 0939   CL 105 08/14/2023 0939   CO2 26 08/14/2023 0939   GLUCOSE 97 08/14/2023 0939   BUN 11 08/14/2023 0939   BUN 16 12/23/2022 0917   CREATININE 1.00 08/14/2023 0939   CALCIUM  8.8 08/14/2023 0939   PROT 6.0 (L) 08/14/2023 0939   PROT 6.2 03/17/2023 0803   ALBUMIN 4.3 03/17/2023 0803   AST 15 08/14/2023 0939   ALT 21 08/14/2023 0939   ALKPHOS 74 03/17/2023 0803   BILITOT 0.9 08/14/2023 0939   BILITOT 0.8 03/17/2023 0803   GFRNONAA 60 07/27/2020 0948   GFRAA 70 07/27/2020 0948    Lab Results  Component Value Date   WBC 4.0 08/13/2022   HGB 14.7 08/13/2022   HCT 42.4 08/13/2022   MCV 98.4 08/13/2022   PLT 115 (L) 08/13/2022   Lab Results  Component Value Date   CHOL 163 03/17/2023   HDL 54 03/17/2023   LDLCALC 85 03/17/2023   TRIG 135 03/17/2023   CHOLHDL 3.0 03/17/2023   Lab Results  Component Value Date   HGBA1C 5.1 03/29/2016    Lab Results  Component Value Date   TSH 1.230 10/04/2014    Lab Results  Component Value Date   PSA 0.62 08/02/2021   PSA 0.85 07/27/2020   PSA 1.6  07/27/2019     Assessment & Plan:   Meds ordered this encounter  Medications  . azithromycin  (ZITHROMAX ) 250 MG tablet    Sig: Take 2 tablets on day 1, then 1 tablet daily on days 2 through 5    Dispense:  6 tablet    Refill:  0  . benzonatate  (TESSALON ) 100 MG capsule    Sig: Take 1  capsule (100 mg total) by mouth 2 (two) times daily as needed for cough.    Dispense:  20 capsule    Refill:  0  . cyclobenzaprine  (FLEXERIL ) 10 MG tablet    Sig: Take 1 tablet (10 mg total) by mouth 3 (three) times daily as needed for muscle spasms.    Dispense:  30 tablet    Refill:  1  Mid-Upper Back Pain: he has been having some back pain in her upper back and neck, which started after carrying a large container of cat litter upstairs. Sending in Flexeril  10 mg to take 3 times daily as needed.   Travel: since he is living in [place] temporarily he requests some antibiotics and prescription cough medication to tide him over until [place]. Sending in Azithromycin  250 mg - take 2 tablets on Day 1 and 1 tablet on Days 2-5 and Tessalon  Perles 100 mg -  take 1 capsule (100 mg total) by mouth 3 (three) times daily as needed.   Hypertension treated with Amlodipine  2.5 mg daily, Carvedilol  3.125 mg twice daily, and Olmesartan  40 mg daily. Blood Pressure: hypertensive today at 134/90, normalized to 138/88. Followed by Dr. Maudine Sos, Cardiologist. He says that his blood pressure is usually a little higher on his at-home device.  Pure Hypercholesterolemia treated with Atorvastatin  40 mg daily. 03/17/2023 Lipid Panel: WNL.  Reviewed 08/14/2023 C-MET, compared to 07/2022: Total Protein 6.0, decreased from 6.5; Globulin 1.8, decreased from 2.1.    Posterior Vitreous Detachment; Myopia treated with corrective lenses.   Benign Prostatic Hyperplasia treated with Flomax  daily and Proscar  5 mg daily.  GERD treated with Nexium  40 mg daily.  B12 Deficiency treated with self-administered B12  injections monthly.  Allergic Rhinitis; Asthma treated with Albuterol  inhaler as needed, Breo Ellipta  inhaler daily, Singulair  10 mg daily, and Zyrtec 10 mg daily.    History of Mild Memory Loss evaluated by [facility] in 2018, untreated, and has remained stable.    I,Emily Lagle,acting as a Neurosurgeon for Sylvan Evener, MD.,have documented all relevant documentation on the behalf of Sylvan Evener, MD,as directed by  Sylvan Evener, MD while in the presence of Sylvan Evener, MD.   ***

## 2023-08-14 ENCOUNTER — Other Ambulatory Visit

## 2023-08-14 ENCOUNTER — Ambulatory Visit: Admitting: Internal Medicine

## 2023-08-14 DIAGNOSIS — E7800 Pure hypercholesterolemia, unspecified: Secondary | ICD-10-CM | POA: Diagnosis not present

## 2023-08-14 DIAGNOSIS — I10 Essential (primary) hypertension: Secondary | ICD-10-CM | POA: Diagnosis not present

## 2023-08-14 DIAGNOSIS — Z125 Encounter for screening for malignant neoplasm of prostate: Secondary | ICD-10-CM | POA: Diagnosis not present

## 2023-08-14 NOTE — Progress Notes (Signed)
 Lab only

## 2023-08-15 ENCOUNTER — Ambulatory Visit (INDEPENDENT_AMBULATORY_CARE_PROVIDER_SITE_OTHER): Admitting: Internal Medicine

## 2023-08-15 VITALS — BP 138/88 | HR 66 | Temp 97.8°F | Ht 73.5 in | Wt 212.4 lb

## 2023-08-15 DIAGNOSIS — J301 Allergic rhinitis due to pollen: Secondary | ICD-10-CM | POA: Diagnosis not present

## 2023-08-15 DIAGNOSIS — H43813 Vitreous degeneration, bilateral: Secondary | ICD-10-CM | POA: Diagnosis not present

## 2023-08-15 DIAGNOSIS — I10 Essential (primary) hypertension: Secondary | ICD-10-CM

## 2023-08-15 DIAGNOSIS — N486 Induration penis plastica: Secondary | ICD-10-CM | POA: Diagnosis not present

## 2023-08-15 DIAGNOSIS — H18522 Epithelial (juvenile) corneal dystrophy, left eye: Secondary | ICD-10-CM | POA: Diagnosis not present

## 2023-08-15 DIAGNOSIS — E538 Deficiency of other specified B group vitamins: Secondary | ICD-10-CM | POA: Diagnosis not present

## 2023-08-15 DIAGNOSIS — H25813 Combined forms of age-related cataract, bilateral: Secondary | ICD-10-CM | POA: Diagnosis not present

## 2023-08-15 DIAGNOSIS — H33321 Round hole, right eye: Secondary | ICD-10-CM | POA: Diagnosis not present

## 2023-08-15 DIAGNOSIS — K219 Gastro-esophageal reflux disease without esophagitis: Secondary | ICD-10-CM

## 2023-08-15 DIAGNOSIS — R413 Other amnesia: Secondary | ICD-10-CM | POA: Diagnosis not present

## 2023-08-15 DIAGNOSIS — R351 Nocturia: Secondary | ICD-10-CM

## 2023-08-15 DIAGNOSIS — N401 Enlarged prostate with lower urinary tract symptoms: Secondary | ICD-10-CM

## 2023-08-15 DIAGNOSIS — M549 Dorsalgia, unspecified: Secondary | ICD-10-CM

## 2023-08-15 DIAGNOSIS — E7800 Pure hypercholesterolemia, unspecified: Secondary | ICD-10-CM

## 2023-08-15 LAB — COMPREHENSIVE METABOLIC PANEL WITH GFR
AG Ratio: 2.3 (calc) (ref 1.0–2.5)
ALT: 21 U/L (ref 9–46)
AST: 15 U/L (ref 10–35)
Albumin: 4.2 g/dL (ref 3.6–5.1)
Alkaline phosphatase (APISO): 56 U/L (ref 35–144)
BUN: 11 mg/dL (ref 7–25)
CO2: 26 mmol/L (ref 20–32)
Calcium: 8.8 mg/dL (ref 8.6–10.3)
Chloride: 105 mmol/L (ref 98–110)
Creat: 1 mg/dL (ref 0.70–1.35)
Globulin: 1.8 g/dL — ABNORMAL LOW (ref 1.9–3.7)
Glucose, Bld: 97 mg/dL (ref 65–99)
Potassium: 4.3 mmol/L (ref 3.5–5.3)
Sodium: 139 mmol/L (ref 135–146)
Total Bilirubin: 0.9 mg/dL (ref 0.2–1.2)
Total Protein: 6 g/dL — ABNORMAL LOW (ref 6.1–8.1)
eGFR: 82 mL/min/{1.73_m2} (ref >=60)

## 2023-08-15 MED ORDER — AZITHROMYCIN 250 MG PO TABS: ORAL_TABLET | ORAL | 0 refills | Status: AC

## 2023-08-15 MED ORDER — CYCLOBENZAPRINE HCL 10 MG PO TABS: 10.0000 mg | ORAL_TABLET | Freq: Three times a day (TID) | ORAL | 1 refills | Status: AC | PRN

## 2023-08-15 MED ORDER — BENZONATATE 100 MG PO CAPS: 100.0000 mg | ORAL_CAPSULE | Freq: Two times a day (BID) | ORAL | 0 refills | Status: AC | PRN

## 2023-08-17 ENCOUNTER — Encounter: Payer: Self-pay | Admitting: Internal Medicine

## 2023-08-17 NOTE — Patient Instructions (Signed)
 It was a pleasure to see you today. Suggest physical therapy or massage for upper back/ neck strain.Sending in Flexeril  10 mg up to 3 times daily as needed. Continue current meds as prescribed. He is agreeable to finding PCP in [place] when he returns there.

## 2023-08-21 ENCOUNTER — Other Ambulatory Visit: Payer: BC Managed Care – PPO

## 2023-08-21 ENCOUNTER — Other Ambulatory Visit: Payer: Self-pay | Admitting: Internal Medicine

## 2023-08-21 DIAGNOSIS — D2262 Melanocytic nevi of left upper limb, including shoulder: Secondary | ICD-10-CM | POA: Diagnosis not present

## 2023-08-21 DIAGNOSIS — D225 Melanocytic nevi of trunk: Secondary | ICD-10-CM | POA: Diagnosis not present

## 2023-08-21 DIAGNOSIS — D485 Neoplasm of uncertain behavior of skin: Secondary | ICD-10-CM | POA: Diagnosis not present

## 2023-08-21 DIAGNOSIS — L821 Other seborrheic keratosis: Secondary | ICD-10-CM | POA: Diagnosis not present

## 2023-08-21 DIAGNOSIS — R3915 Urgency of urination: Secondary | ICD-10-CM | POA: Diagnosis not present

## 2023-08-21 DIAGNOSIS — D2271 Melanocytic nevi of right lower limb, including hip: Secondary | ICD-10-CM | POA: Diagnosis not present

## 2023-08-21 DIAGNOSIS — L820 Inflamed seborrheic keratosis: Secondary | ICD-10-CM | POA: Diagnosis not present

## 2023-08-21 DIAGNOSIS — D2261 Melanocytic nevi of right upper limb, including shoulder: Secondary | ICD-10-CM | POA: Diagnosis not present

## 2023-08-21 DIAGNOSIS — L814 Other melanin hyperpigmentation: Secondary | ICD-10-CM | POA: Diagnosis not present

## 2023-08-21 DIAGNOSIS — L812 Freckles: Secondary | ICD-10-CM | POA: Diagnosis not present

## 2023-08-21 DIAGNOSIS — R3912 Poor urinary stream: Secondary | ICD-10-CM | POA: Diagnosis not present

## 2023-08-25 ENCOUNTER — Other Ambulatory Visit (HOSPITAL_COMMUNITY): Payer: Self-pay

## 2023-08-25 ENCOUNTER — Encounter: Payer: BC Managed Care – PPO | Admitting: Internal Medicine

## 2023-08-25 ENCOUNTER — Other Ambulatory Visit: Payer: Self-pay | Admitting: Internal Medicine

## 2023-08-25 ENCOUNTER — Other Ambulatory Visit (HOSPITAL_BASED_OUTPATIENT_CLINIC_OR_DEPARTMENT_OTHER): Payer: Self-pay | Admitting: Family

## 2023-08-25 DIAGNOSIS — E7800 Pure hypercholesterolemia, unspecified: Secondary | ICD-10-CM

## 2023-08-26 ENCOUNTER — Telehealth: Payer: Self-pay | Admitting: Pharmacy Technician

## 2023-08-26 ENCOUNTER — Other Ambulatory Visit (HOSPITAL_COMMUNITY): Payer: Self-pay

## 2023-08-26 NOTE — Telephone Encounter (Signed)
 Pharmacy Patient Advocate Encounter  Received notification from [employer] that Prior Authorization for Breo Ellipta  100-25 mcg/act inhaler has been APPROVED from 08/25/2023 to 08/30/2023. Unable to obtain price due to refill too soon rejection, last fill date 08/25/2023 next available fill date07/26/2025.
